# Patient Record
Sex: Female | Born: 1950 | Race: Black or African American | Hispanic: No | Marital: Single | Smoking: Current every day smoker
Health system: Southern US, Community
[De-identification: ages and names within clinical notes are randomized; demographics above are authoritative.]

## PROBLEM LIST (undated history)

## (undated) DIAGNOSIS — C7A8 Other malignant neuroendocrine tumors: Secondary | ICD-10-CM

## (undated) DIAGNOSIS — R42 Dizziness and giddiness: Secondary | ICD-10-CM

## (undated) DIAGNOSIS — Z87891 Personal history of nicotine dependence: Secondary | ICD-10-CM

## (undated) HISTORY — DX: Other malignant neuroendocrine tumors: C7A.8

## (undated) HISTORY — DX: Personal history of nicotine dependence: Z87.891

---

## 2004-12-22 ENCOUNTER — Ambulatory Visit: Payer: Self-pay | Admitting: Obstetrics & Gynecology

## 2004-12-26 ENCOUNTER — Ambulatory Visit: Payer: Self-pay | Admitting: Family Medicine

## 2005-03-07 ENCOUNTER — Ambulatory Visit: Payer: Self-pay | Admitting: Family Medicine

## 2005-04-10 ENCOUNTER — Ambulatory Visit: Payer: Self-pay | Admitting: Family Medicine

## 2008-05-11 ENCOUNTER — Ambulatory Visit: Payer: Self-pay

## 2008-11-10 ENCOUNTER — Ambulatory Visit: Payer: Self-pay

## 2009-07-18 ENCOUNTER — Emergency Department: Payer: Self-pay | Admitting: Emergency Medicine

## 2009-08-04 ENCOUNTER — Ambulatory Visit: Payer: Self-pay | Admitting: Sports Medicine

## 2012-05-16 ENCOUNTER — Ambulatory Visit: Payer: Self-pay | Admitting: Emergency Medicine

## 2012-05-16 LAB — RAPID INFLUENZA A&B ANTIGENS

## 2016-10-12 ENCOUNTER — Encounter: Payer: Self-pay | Admitting: Obstetrics & Gynecology

## 2016-10-15 ENCOUNTER — Ambulatory Visit (INDEPENDENT_AMBULATORY_CARE_PROVIDER_SITE_OTHER): Payer: PPO | Admitting: Obstetrics & Gynecology

## 2016-10-15 ENCOUNTER — Encounter: Payer: Self-pay | Admitting: Obstetrics & Gynecology

## 2016-10-15 VITALS — BP 120/70 | HR 74 | Ht 62.0 in | Wt 159.0 lb

## 2016-10-15 DIAGNOSIS — Z131 Encounter for screening for diabetes mellitus: Secondary | ICD-10-CM | POA: Diagnosis not present

## 2016-10-15 DIAGNOSIS — Z1321 Encounter for screening for nutritional disorder: Secondary | ICD-10-CM

## 2016-10-15 DIAGNOSIS — Z1231 Encounter for screening mammogram for malignant neoplasm of breast: Secondary | ICD-10-CM | POA: Diagnosis not present

## 2016-10-15 DIAGNOSIS — Z1211 Encounter for screening for malignant neoplasm of colon: Secondary | ICD-10-CM

## 2016-10-15 DIAGNOSIS — Z0000 Encounter for general adult medical examination without abnormal findings: Secondary | ICD-10-CM

## 2016-10-15 DIAGNOSIS — Z13220 Encounter for screening for lipoid disorders: Secondary | ICD-10-CM

## 2016-10-15 DIAGNOSIS — Z1329 Encounter for screening for other suspected endocrine disorder: Secondary | ICD-10-CM | POA: Diagnosis not present

## 2016-10-15 DIAGNOSIS — Z124 Encounter for screening for malignant neoplasm of cervix: Secondary | ICD-10-CM

## 2016-10-15 DIAGNOSIS — Z1239 Encounter for other screening for malignant neoplasm of breast: Secondary | ICD-10-CM

## 2016-10-15 DIAGNOSIS — Z01419 Encounter for gynecological examination (general) (routine) without abnormal findings: Secondary | ICD-10-CM

## 2016-10-15 NOTE — Patient Instructions (Signed)
PAP every three years Mammogram every year Colonoscopy every 10 years recommended Labs yearly

## 2016-10-15 NOTE — Progress Notes (Signed)
  HPI:      [name] is a 66 y.o. G1P1001 who LMP was in the past, she presents today for her annual examination.  The patient has no complaints today. The patient is sexually active. Herlast pap: approximate date 2016 and was abnormal: +HPV and last mammogram: was normal.  The patient does perform self breast exams.  There is no notable family history of breast or ovarian cancer in her family. The patient is not taking hormone replacement therapy. Patient denies post-menopausal vaginal bleeding.   The patient has regular exercise: yes. The patient denies current symptoms of depression.    GYN Hx: Last Colonoscopy:years ago. Normal.  Last DEXA: several years ago.    PMHx: History reviewed. No pertinent past medical history. History reviewed. No pertinent surgical history. Family History  Problem Relation Age of Onset  . Stroke Mother   . Colon cancer Father   . Prostate cancer Father   . Breast cancer Maternal Aunt 70  . Liver cancer Maternal Aunt   . Stomach cancer Maternal Aunt    Social History  Substance Use Topics  . Smoking status: Light Tobacco Smoker  . Smokeless tobacco: Never Used  . Alcohol use Yes   No current outpatient prescriptions on file. Allergies: Patient has no known allergies.  ROS  Objective: BP 120/70   Pulse 74   Ht 5\' 2"  (1.575 m)   Wt 159 lb (72.1 kg)   BMI 29.08 kg/m   Filed Weights   10/15/16 1346  Weight: 159 lb (72.1 kg)   Body mass index is 29.08 kg/m. OBGyn Exam  Assessment: Annual Exam 1. Annual physical exam   2. Screening for breast cancer   3. Screen for colon cancer   4. Screening for cervical cancer   5. Screening for cholesterol level   6. Encounter for vitamin deficiency screening   7. Screening for thyroid disorder   8. Screening for diabetes mellitus     Plan:            1.  Cervical Screening-  Pap smear done today, yeearly due to recent h/o HPV  2. Breast screening- Exam annually and mammogram  scheduled  3. Colonoscopy every 10 years, she refuses; Hemoccult testing after age 65  4. Labs Ordered today  5. Counseling for hormonal therapy: none     F/U  Return in about 1 year (around 10/15/2017) for Annual.  Barnett Applebaum, MD, [facility], [facility] 10/15/2016  2:10 PM

## 2016-10-16 ENCOUNTER — Encounter: Payer: Self-pay | Admitting: Obstetrics & Gynecology

## 2016-10-16 LAB — LIPID PANEL
Chol/HDL Ratio: 5.5 ratio — ABNORMAL HIGH (ref 0.0–4.4)
Cholesterol, Total: 227 mg/dL — ABNORMAL HIGH (ref 100–199)
HDL: 41 mg/dL (ref >39)
LDL Calculated: 150 mg/dL — ABNORMAL HIGH (ref 0–99)
Triglycerides: 182 mg/dL — ABNORMAL HIGH (ref 0–149)
VLDL Cholesterol Cal: 36 mg/dL (ref 5–40)

## 2016-10-16 LAB — VITAMIN D 25 HYDROXY (VIT D DEFICIENCY, FRACTURES): Vit D, 25-Hydroxy: 15.3 ng/mL — ABNORMAL LOW (ref 30.0–100.0)

## 2016-10-16 LAB — HEMOGLOBIN A1C
Est. average glucose Bld gHb Est-mCnc: 123 mg/dL
Hgb A1c MFr Bld: 5.9 % — ABNORMAL HIGH (ref 4.8–5.6)

## 2016-10-16 LAB — TSH: TSH: 0.454 u[IU]/mL (ref 0.450–4.500)

## 2016-10-17 LAB — IGP, APTIMA HPV
HPV Aptima: POSITIVE — AB
PAP Smear Comment: 0

## 2016-10-30 ENCOUNTER — Telehealth: Payer: Self-pay

## 2016-10-30 NOTE — Telephone Encounter (Signed)
Pt calling about results she received from her pap and physical.  [phone]

## 2016-10-30 NOTE — Telephone Encounter (Signed)
Looks like pap positive for HPV please advise

## 2017-01-29 NOTE — Telephone Encounter (Signed)
Letter was sent

## 2017-04-08 DIAGNOSIS — G4762 Sleep related leg cramps: Secondary | ICD-10-CM | POA: Diagnosis not present

## 2017-04-08 DIAGNOSIS — Z1389 Encounter for screening for other disorder: Secondary | ICD-10-CM | POA: Diagnosis not present

## 2017-04-08 DIAGNOSIS — Z23 Encounter for immunization: Secondary | ICD-10-CM | POA: Diagnosis not present

## 2017-04-08 DIAGNOSIS — R87810 Cervical high risk human papillomavirus (HPV) DNA test positive: Secondary | ICD-10-CM | POA: Diagnosis not present

## 2017-04-08 DIAGNOSIS — Z0000 Encounter for general adult medical examination without abnormal findings: Secondary | ICD-10-CM | POA: Diagnosis not present

## 2017-06-14 ENCOUNTER — Ambulatory Visit (INDEPENDENT_AMBULATORY_CARE_PROVIDER_SITE_OTHER): Payer: PPO | Admitting: Family Medicine

## 2017-06-14 ENCOUNTER — Encounter: Payer: Self-pay | Admitting: Family Medicine

## 2017-06-14 VITALS — BP 114/76 | HR 78 | Resp 16 | Ht 62.0 in | Wt 164.0 lb

## 2017-06-14 DIAGNOSIS — Z23 Encounter for immunization: Secondary | ICD-10-CM

## 2017-06-14 DIAGNOSIS — F329 Major depressive disorder, single episode, unspecified: Secondary | ICD-10-CM

## 2017-06-14 DIAGNOSIS — R2681 Unsteadiness on feet: Secondary | ICD-10-CM | POA: Insufficient documentation

## 2017-06-14 DIAGNOSIS — F32A Depression, unspecified: Secondary | ICD-10-CM

## 2017-06-14 DIAGNOSIS — E669 Obesity, unspecified: Secondary | ICD-10-CM | POA: Diagnosis not present

## 2017-06-14 DIAGNOSIS — E559 Vitamin D deficiency, unspecified: Secondary | ICD-10-CM | POA: Diagnosis not present

## 2017-06-14 DIAGNOSIS — E785 Hyperlipidemia, unspecified: Secondary | ICD-10-CM | POA: Diagnosis not present

## 2017-06-14 DIAGNOSIS — G4762 Sleep related leg cramps: Secondary | ICD-10-CM | POA: Diagnosis not present

## 2017-06-14 DIAGNOSIS — Z1239 Encounter for other screening for malignant neoplasm of breast: Secondary | ICD-10-CM

## 2017-06-14 DIAGNOSIS — G4700 Insomnia, unspecified: Secondary | ICD-10-CM | POA: Diagnosis not present

## 2017-06-14 DIAGNOSIS — R7303 Prediabetes: Secondary | ICD-10-CM | POA: Diagnosis not present

## 2017-06-14 DIAGNOSIS — Z1231 Encounter for screening mammogram for malignant neoplasm of breast: Secondary | ICD-10-CM | POA: Diagnosis not present

## 2017-06-14 DIAGNOSIS — B977 Papillomavirus as the cause of diseases classified elsewhere: Secondary | ICD-10-CM | POA: Insufficient documentation

## 2017-06-14 DIAGNOSIS — E66811 Obesity, class 1: Secondary | ICD-10-CM

## 2017-06-14 DIAGNOSIS — Z1211 Encounter for screening for malignant neoplasm of colon: Secondary | ICD-10-CM

## 2017-06-14 MED ORDER — MIRTAZAPINE 15 MG PO TABS: 15.0000 mg | ORAL_TABLET | Freq: Every day | ORAL | 2 refills | Status: DC

## 2017-06-14 MED ORDER — ZOSTER VAC RECOMB ADJUVANTED 50 MCG/0.5ML IM SUSR: 0.5000 mL | Freq: Once | INTRAMUSCULAR | 1 refills | Status: AC

## 2017-06-14 NOTE — Progress Notes (Signed)
Date:  06/14/2017   Name:  Kristin Escobar   DOB:  July 20, 1950   MRN:  [id number]  PCP:  Adline Potter, MD    Chief Complaint: Establish Care (Needs referral to eye doctor. )   History of Present Illness:  This is a 67 y.o. female seen for initial visit. Blood work done in May by GYN showed prediabetes and HLD, also vit D def and HPV positive. On no rx or OTC meds. Admits some depression and insomnia. Also c/o more gassy lately, Backus, halitosis, dentist no help. 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 Low BP in past. Father died prostate/colon ca 85, mother alive 48 with HTN, some DM in family. Tet imm 4 yrs ago, flu imm in Nov, no pneumo or zoster imms, last mammo 5 yrs ago, colonoscopy 10 yrs ago showed polyps  Review of Systems:  Review of Systems  Constitutional: Negative for chills and fever.  HENT: Negative for ear pain, sinus pain, sore throat and trouble swallowing.   Eyes: Negative for pain.  Respiratory: Negative for cough and shortness of breath.   Cardiovascular: Negative for chest pain and leg swelling.  Gastrointestinal: Negative for abdominal pain.  Endocrine: Negative for polydipsia and polyuria.  Genitourinary: Negative for difficulty urinating.  Musculoskeletal: Negative for joint swelling.  Neurological: Negative for syncope and light-headedness.  Hematological: Negative for adenopathy.    Patient Active Problem List   Diagnosis Date Noted  . Vitamin D deficiency 06/14/2017  . Gait instability 06/14/2017  . Hyperlipidemia 06/14/2017  . Depression 06/14/2017  . Insomnia 06/14/2017  . Obesity (BMI 30.0-34.9) 06/14/2017  . Prediabetes 06/14/2017  . 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 Nocturnal leg cramps 06/14/2017    Prior to Admission medications   Medication Sig Start Date End Date Taking? Authorizing Provider  Cholecalciferol (VITAMIN D3) 2000 units capsule Take 2,000 Units by mouth daily.   Yes [provider]  mirtazapine (REMERON) 15 MG  tablet Take 1 tablet (15 mg total) by mouth at bedtime. 06/14/17   Jozie Wulf, Gwyndolyn Saxon, MD  Zoster Vaccine Adjuvanted [facility]) injection Inject 0.5 mLs into the muscle once for 1 dose. 06/14/17 06/14/17  Adline Potter, MD    No Known Allergies  History reviewed. No pertinent surgical history.  Social History   Tobacco Use  . Smoking status: Light Tobacco Smoker    Packs/day: 0.25    Types: Cigarettes  . Smokeless tobacco: Never Used  Substance Use Topics  . Alcohol use: Yes  . Drug use: No    Family History  Problem Relation Age of Onset  . Stroke Mother   . Colon cancer Father   . Prostate cancer Father   . Breast cancer Maternal Aunt [id number]  . Liver cancer Maternal Aunt   . Stomach cancer Maternal Aunt     Medication list has been reviewed and updated.  Physical Examination: BP 114/76   Pulse 78   Resp 16   Ht 5\' 2"  (1.575 m)   Wt 164 lb (74.4 kg)   SpO2 98%   BMI 30.00 kg/m   Physical Exam  Constitutional: She appears well-developed and well-nourished.  HENT:  Head: Normocephalic and atraumatic.  Right Ear: External ear normal.  Left Ear: External ear normal.  Nose: Nose normal.  Mouth/Throat: Oropharynx is clear and moist.  TMs clear  Eyes: Conjunctivae and EOM are normal. Pupils are equal, round, and reactive to light.  Neck: Normal range of motion. Neck supple. No thyromegaly present.  Cardiovascular: Normal rate, regular rhythm and normal heart sounds.  Pulmonary/Chest: Effort normal and breath sounds normal.  Abdominal: Soft. She exhibits no distension and no mass. There is no tenderness.  Musculoskeletal: She exhibits no edema.  Lymphadenopathy:    She has no cervical adenopathy.  Neurological: She is alert. Coordination normal.  Romberg wobbly, gait sl unsteady  Skin: Skin is warm and dry.  Psychiatric: She has a normal mood and affect. Her behavior is normal.  Nursing note and vitals reviewed.   Assessment and Plan:  1. Prediabetes Dx/px  discussed - HgB A1c  2. HPV in female Recommend yearly pelvic exam per GYN  3. Depression, unspecified depression type Begin Remeron 15 mg qhs  4. Insomnia, unspecified type Remeron should help  5. Hyperlipidemia, unspecified hyperlipidemia type Recheck - TSH - Lipid Profile  6. Vitamin D deficiency Begin vit D 2000 IU daily, consider recheck level next visit  7. Gait instability - B12  8. Nocturnal leg cramps - Comprehensive Metabolic Panel (CMET) - CBC - Magnesium  9. Obesity (BMI 30.0-34.9) Exercise/weight loss discussed  10. Breast cancer screening - MM Digital Screening; Future  11. Colon cancer screening - Ambulatory referral to Gastroenterology  12. Need for pneumococcal vaccination - Pneumococcal conjugate vaccine 13-valent  13. Need for zoster vaccination - Zoster Vaccine Adjuvanted [facility]) injection; Inject 0.5 mLs into the muscle once for 1 dose.  Dispense: 0.5 mL; Refill: 1  Return in about 4 weeks (around 07/12/2017).   45 mins spent with pt over half in counseling  Satira Anis. [facility]  06/14/2017

## 2017-06-15 LAB — COMPREHENSIVE METABOLIC PANEL
ALT: 22 IU/L (ref 0–32)
AST: 23 IU/L (ref 0–40)
Albumin/Globulin Ratio: 1.5 (ref 1.2–2.2)
Albumin: 4.5 g/dL (ref 3.6–4.8)
Alkaline Phosphatase: 97 IU/L (ref 39–117)
BUN/Creatinine Ratio: 15 (ref 12–28)
BUN: 13 mg/dL (ref 8–27)
Bilirubin Total: 0.5 mg/dL (ref 0.0–1.2)
CO2: 21 mmol/L (ref 20–29)
Calcium: 9.9 mg/dL (ref 8.7–10.3)
Chloride: 106 mmol/L (ref 96–106)
Creatinine, Ser: 0.86 mg/dL (ref 0.57–1.00)
GFR calc Af Amer: 82 mL/min/{1.73_m2} (ref >59)
GFR calc non Af Amer: 71 mL/min/{1.73_m2} (ref >59)
Globulin, Total: 3.1 g/dL (ref 1.5–4.5)
Glucose: 91 mg/dL (ref 65–99)
Potassium: 4.7 mmol/L (ref 3.5–5.2)
Sodium: 145 mmol/L — ABNORMAL HIGH (ref 134–144)
Total Protein: 7.6 g/dL (ref 6.0–8.5)

## 2017-06-15 LAB — LIPID PANEL
Chol/HDL Ratio: 4.9 ratio — ABNORMAL HIGH (ref 0.0–4.4)
Cholesterol, Total: 242 mg/dL — ABNORMAL HIGH (ref 100–199)
HDL: 49 mg/dL (ref >39)
LDL Calculated: 151 mg/dL — ABNORMAL HIGH (ref 0–99)
Triglycerides: 209 mg/dL — ABNORMAL HIGH (ref 0–149)
VLDL Cholesterol Cal: 42 mg/dL — ABNORMAL HIGH (ref 5–40)

## 2017-06-15 LAB — VITAMIN B12: Vitamin B-12: 464 pg/mL (ref 232–1245)

## 2017-06-15 LAB — CBC
Hematocrit: 41.8 % (ref 34.0–46.6)
Hemoglobin: 14.3 g/dL (ref 11.1–15.9)
MCH: 32.4 pg (ref 26.6–33.0)
MCHC: 34.2 g/dL (ref 31.5–35.7)
MCV: 95 fL (ref 79–97)
Platelets: 237 10*3/uL (ref 150–379)
RBC: 4.41 x10E6/uL (ref 3.77–5.28)
RDW: 13.8 % (ref 12.3–15.4)
WBC: 5.6 10*3/uL (ref 3.4–10.8)

## 2017-06-15 LAB — MAGNESIUM: Magnesium: 2.1 mg/dL (ref 1.6–2.3)

## 2017-06-15 LAB — HEMOGLOBIN A1C
Est. average glucose Bld gHb Est-mCnc: 120 mg/dL
Hgb A1c MFr Bld: 5.8 % — ABNORMAL HIGH (ref 4.8–5.6)

## 2017-06-15 LAB — TSH: TSH: 0.592 u[IU]/mL (ref 0.450–4.500)

## 2017-06-25 ENCOUNTER — Other Ambulatory Visit: Payer: Self-pay

## 2017-06-25 DIAGNOSIS — Z1211 Encounter for screening for malignant neoplasm of colon: Secondary | ICD-10-CM

## 2017-06-25 DIAGNOSIS — Z860100 Personal history of colon polyps, unspecified: Secondary | ICD-10-CM

## 2017-06-25 NOTE — Progress Notes (Signed)
Gastroenterology Pre-Procedure Review  Request Date: 07/16/2017 Requesting Physician: Dr. Marius Ditch  PATIENT REVIEW QUESTIONS: The patient responded to the following health history questions as indicated:    1. Are you having any GI issues? no 2. Do you have a personal history of Polyps? yes (with last colonoscopy) 3. Do you have a family history of Colon Cancer or Polyps? yes (sister) 4. Diabetes Mellitus? no 5. Joint replacements in the past 12 months?no 6. Major health problems in the past 3 months?no 7. Any artificial heart valves, MVP, or defibrillator?no    MEDICATIONS & ALLERGIES:    Patient reports the following regarding taking any anticoagulation/antiplatelet therapy:   Plavix, Coumadin, Eliquis, Xarelto, Lovenox, Pradaxa, Brilinta, or Effient? no Aspirin? no  Patient confirms/reports the following medications:  Current Outpatient Medications  Medication Sig Dispense Refill  . Cholecalciferol (VITAMIN D3) 2000 units capsule Take 2,000 Units by mouth daily.    . mirtazapine (REMERON) 15 MG tablet Take 1 tablet (15 mg total) by mouth at bedtime. 30 tablet 2   No current facility-administered medications for this visit.     Patient confirms/reports the following allergies:  No Known Allergies  Orders Placed This Encounter  Procedures  . Procedural/ Surgical Case Request: COLONOSCOPY WITH PROPOFOL    Standing Status:   Standing    Number of Occurrences:   1    Order Specific Question:   Pre-op diagnosis    Answer:   z86.010 hx of colon polyps/z12.11 screening for colon cancer    Order Specific Question:   CPT Code    Answer:   93570    AUTHORIZATION INFORMATION Primary Insurance: 1D#: Group #:  Secondary Insurance: 1D#: Group #:  SCHEDULE INFORMATION: Date: 07/16/2017 Time: Location:[facility]

## 2017-06-26 ENCOUNTER — Ambulatory Visit
Admission: RE | Admit: 2017-06-26 | Discharge: 2017-06-26 | Disposition: A | Discharge status: Home / Self Care | Payer: PPO | Source: Ambulatory Visit | Attending: Family Medicine | Admitting: Family Medicine

## 2017-06-26 DIAGNOSIS — Z1231 Encounter for screening mammogram for malignant neoplasm of breast: Secondary | ICD-10-CM | POA: Insufficient documentation

## 2017-06-26 DIAGNOSIS — Z1239 Encounter for other screening for malignant neoplasm of breast: Secondary | ICD-10-CM

## 2017-07-02 ENCOUNTER — Inpatient Hospital Stay
Admission: RE | Admit: 2017-07-02 | Discharge: 2017-07-02 | Disposition: A | Discharge status: Home / Self Care | Payer: Self-pay | Source: Ambulatory Visit | Attending: *Deleted | Admitting: *Deleted

## 2017-07-02 ENCOUNTER — Other Ambulatory Visit: Payer: Self-pay | Admitting: *Deleted

## 2017-07-02 DIAGNOSIS — Z9289 Personal history of other medical treatment: Secondary | ICD-10-CM

## 2017-07-03 ENCOUNTER — Telehealth: Payer: Self-pay | Admitting: Gastroenterology

## 2017-07-03 NOTE — Telephone Encounter (Signed)
Patient has been informed her colonoscopy is scheduled for 07/17/17 at [facility].

## 2017-07-03 NOTE — Telephone Encounter (Signed)
Patient LVM and is confused on what day her procedure is on, please call her.

## 2017-07-11 ENCOUNTER — Encounter: Payer: Self-pay | Admitting: *Deleted

## 2017-07-11 ENCOUNTER — Other Ambulatory Visit: Payer: Self-pay

## 2017-07-12 ENCOUNTER — Encounter: Payer: Self-pay | Admitting: Family Medicine

## 2017-07-12 ENCOUNTER — Ambulatory Visit (INDEPENDENT_AMBULATORY_CARE_PROVIDER_SITE_OTHER): Payer: PPO | Admitting: Family Medicine

## 2017-07-12 VITALS — BP 118/80 | HR 76 | Resp 16 | Ht 62.0 in | Wt 164.0 lb

## 2017-07-12 DIAGNOSIS — F17201 Nicotine dependence, unspecified, in remission: Secondary | ICD-10-CM

## 2017-07-12 DIAGNOSIS — F17200 Nicotine dependence, unspecified, uncomplicated: Secondary | ICD-10-CM | POA: Diagnosis not present

## 2017-07-12 DIAGNOSIS — E785 Hyperlipidemia, unspecified: Secondary | ICD-10-CM | POA: Diagnosis not present

## 2017-07-12 DIAGNOSIS — F329 Major depressive disorder, single episode, unspecified: Secondary | ICD-10-CM | POA: Diagnosis not present

## 2017-07-12 DIAGNOSIS — E559 Vitamin D deficiency, unspecified: Secondary | ICD-10-CM

## 2017-07-12 DIAGNOSIS — G4700 Insomnia, unspecified: Secondary | ICD-10-CM | POA: Diagnosis not present

## 2017-07-12 DIAGNOSIS — R7303 Prediabetes: Secondary | ICD-10-CM

## 2017-07-12 DIAGNOSIS — F32A Depression, unspecified: Secondary | ICD-10-CM

## 2017-07-12 MED ORDER — BUPROPION HCL ER (XL) 150 MG PO TB24: 150.0000 mg | ORAL_TABLET | Freq: Every day | ORAL | 2 refills | Status: DC

## 2017-07-12 NOTE — Progress Notes (Signed)
Date:  07/12/2017   Name:  Kristin Escobar   DOB:  [birth year]   MRN:  [id number]  PCP:  Adline Potter, MD    Chief Complaint: Hyperlipidemia (getting colonscopy Wed on special prep diet )   History of Present Illness:  This is a 67 y.o. female seen for one month f/u from initial visit. Never started Remeron, concerned about addiction, mood still poor and having terminal insomnia. Wants to quit smoking, has quit for years in past but goes back, quit for three weeks in January, wants to try medication. Prediabetes, HLD stable last visit butvit D level low, on supplement. For colonoscopy next week.   Review of Systems:  Review of Systems  Constitutional: Negative for chills and fever.  Respiratory: Negative for cough and shortness of breath.   Cardiovascular: Negative for chest pain and leg swelling.  Genitourinary: Negative for difficulty urinating.  Neurological: Negative for syncope and light-headedness.    Patient Active Problem List   Diagnosis Date Noted  . Smoker 07/12/2017  . Vitamin D deficiency 06/14/2017  . Gait instability 06/14/2017  . Hyperlipidemia 06/14/2017  . Depression 06/14/2017  . Insomnia 06/14/2017  . Obesity (BMI 30.0-34.9) 06/14/2017  . Prediabetes 06/14/2017  . 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 Nocturnal leg cramps 06/14/2017    Prior to Admission medications   Medication Sig Start Date End Date Taking? Authorizing Provider  Cholecalciferol (VITAMIN D3) 2000 units capsule Take 2,000 Units by mouth daily.   Yes [provider]  buPROPion (WELLBUTRIN XL) 150 MG 24 hr tablet Take 1 tablet (150 mg total) by mouth daily. 07/12/17   Adline Potter, MD    No Known Allergies  History reviewed. No pertinent surgical history.  Social History   Tobacco Use  . Smoking status: Light Tobacco Smoker    Packs/day: 0.25    Types: Cigarettes  . Smokeless tobacco: Never Used  . Tobacco comment: 1 pack every 1-2 weeks  Substance Use Topics  . Alcohol use:  Yes    Comment: 1-2 drinks/month  . Drug use: No    Family History  Problem Relation Age of Onset  . Stroke Mother   . Colon cancer Father   . Prostate cancer Father   . Breast cancer Maternal Aunt 70  . Liver cancer Maternal Aunt   . Stomach cancer Maternal Aunt     Medication list has been reviewed and updated.  Physical Examination: BP 118/80   Pulse 76   Resp 16   Ht 5\' 2"  (1.575 m)   Wt 164 lb (74.4 kg)   BMI 30.00 kg/m   Physical Exam  Constitutional: She appears well-developed and well-nourished.  Cardiovascular: Normal rate, regular rhythm and normal heart sounds.  Pulmonary/Chest: Effort normal and breath sounds normal.  Neurological: She is alert.  Skin: Skin is warm and dry.  Psychiatric: She has a normal mood and affect. Her behavior is normal.  Nursing note and vitals reviewed.   Assessment and Plan:  1. Depression, unspecified depression type Wellbutrin XR 150 mg daily, may also help stop smoking  2. Insomnia, unspecified type May improve on Wellbutrin XR  3. Hyperlipidemia, unspecified hyperlipidemia type Stable, CVR 7.4%, monitor  4. Prediabetes Stable, a1c 5.8%  5. Smoker Discussed physical vs. psychological issues, Wellbutrin XR may help  6. Vitamin D deficiency On supplement - Vitamin D (25 hydroxy)  Return in about 3 months (around 10/09/2017).  Satira Anis. [facility]  07/12/2017

## 2017-07-13 LAB — VITAMIN D 25 HYDROXY (VIT D DEFICIENCY, FRACTURES): Vit D, 25-Hydroxy: 16.7 ng/mL — ABNORMAL LOW (ref 30.0–100.0)

## 2017-07-15 ENCOUNTER — Other Ambulatory Visit: Payer: Self-pay | Admitting: Family Medicine

## 2017-07-15 MED ORDER — VITAMIN D3 125 MCG (5000 UT) PO CAPS: 1.0000 | ORAL_CAPSULE | Freq: Every day | ORAL | Status: DC

## 2017-07-15 NOTE — Discharge Instructions (Signed)
General Anesthesia, Adult, Care After °These instructions provide you with information about caring for yourself after your procedure. Your health care provider may also give you more specific instructions. Your treatment has been planned according to current medical practices, but problems sometimes occur. Call your health care provider if you have any problems or questions after your procedure. °What can I expect after the procedure? °After the procedure, it is common to have: °· Vomiting. °· A sore throat. °· Mental slowness. ° °It is common to feel: °· Nauseous. °· Cold or shivery. °· Sleepy. °· Tired. °· Sore or achy, even in parts of your body where you did not have surgery. ° °Follow these instructions at home: °For at least 24 hours after the procedure: °· Do not: °? Participate in activities where you could fall or become injured. °? Drive. °? Use heavy machinery. °? Drink alcohol. °? Take sleeping pills or medicines that cause drowsiness. °? Make important decisions or sign legal documents. °? Take care of children on your own. °· Rest. °Eating and drinking °· If you vomit, drink water, juice, or soup when you can drink without vomiting. °· Drink enough fluid to keep your urine clear or pale yellow. °· Make sure you have little or no nausea before eating solid foods. °· Follow the diet recommended by your health care provider. °General instructions °· Have a responsible adult stay with you until you are awake and alert. °· Return to your normal activities as told by your health care provider. Ask your health care provider what activities are safe for you. °· Take over-the-counter and prescription medicines only as told by your health care provider. °· If you smoke, do not smoke without supervision. °· Keep all follow-up visits as told by your health care provider. This is important. °Contact a health care provider if: °· You continue to have nausea or vomiting at home, and medicines are not helpful. °· You  cannot drink fluids or start eating again. °· You cannot urinate after 8-12 hours. °· You develop a skin rash. °· You have fever. °· You have increasing redness at the site of your procedure. °Get help right away if: °· You have difficulty breathing. °· You have chest pain. °· You have unexpected bleeding. °· You feel that you are having a life-threatening or urgent problem. °This information is not intended to replace advice given to you by your health care provider. Make sure you discuss any questions you have with your health care provider. °Document Released: 08/20/2000 Document Revised: 10/17/2015 Document Reviewed: 04/28/2015 °Elsevier Interactive Patient Education © 2018 [employer] ° °

## 2017-07-17 ENCOUNTER — Ambulatory Visit: Payer: PPO | Admitting: Anesthesiology

## 2017-07-17 ENCOUNTER — Encounter
Admission: RE | Disposition: A | Discharge status: Home / Self Care | Payer: Self-pay | Source: Ambulatory Visit | Attending: Gastroenterology

## 2017-07-17 ENCOUNTER — Ambulatory Visit
Admission: RE | Admit: 2017-07-17 | Discharge: 2017-07-17 | Disposition: A | Discharge status: Home / Self Care | Payer: PPO | Source: Ambulatory Visit | Attending: Gastroenterology | Admitting: Gastroenterology

## 2017-07-17 DIAGNOSIS — D1339 Benign neoplasm of other parts of small intestine: Secondary | ICD-10-CM | POA: Diagnosis not present

## 2017-07-17 DIAGNOSIS — Z1211 Encounter for screening for malignant neoplasm of colon: Secondary | ICD-10-CM | POA: Insufficient documentation

## 2017-07-17 DIAGNOSIS — D3A8 Other benign neuroendocrine tumors: Secondary | ICD-10-CM | POA: Insufficient documentation

## 2017-07-17 DIAGNOSIS — D124 Benign neoplasm of descending colon: Secondary | ICD-10-CM | POA: Diagnosis not present

## 2017-07-17 DIAGNOSIS — D120 Benign neoplasm of cecum: Secondary | ICD-10-CM | POA: Diagnosis not present

## 2017-07-17 DIAGNOSIS — F17210 Nicotine dependence, cigarettes, uncomplicated: Secondary | ICD-10-CM | POA: Insufficient documentation

## 2017-07-17 DIAGNOSIS — K5730 Diverticulosis of large intestine without perforation or abscess without bleeding: Secondary | ICD-10-CM | POA: Diagnosis not present

## 2017-07-17 DIAGNOSIS — F329 Major depressive disorder, single episode, unspecified: Secondary | ICD-10-CM | POA: Insufficient documentation

## 2017-07-17 DIAGNOSIS — D3A00 Benign carcinoid tumor of unspecified site: Secondary | ICD-10-CM | POA: Diagnosis not present

## 2017-07-17 HISTORY — PX: POLYPECTOMY: SHX5525

## 2017-07-17 HISTORY — PX: COLONOSCOPY WITH PROPOFOL: SHX5780

## 2017-07-17 HISTORY — DX: Dizziness and giddiness: R42

## 2017-07-17 SURGERY — COLONOSCOPY WITH PROPOFOL
Anesthesia: General | Site: Rectum | Wound class: Contaminated

## 2017-07-17 MED ORDER — STERILE WATER FOR IRRIGATION IR SOLN
Status: DC | PRN
  Administered 2017-07-17: .5 mL

## 2017-07-17 MED ORDER — ACETAMINOPHEN 160 MG/5ML PO SOLN: 325.0000 mg | ORAL | Status: DC | PRN

## 2017-07-17 MED ORDER — LIDOCAINE HCL (CARDIAC) 20 MG/ML IV SOLN
INTRAVENOUS | Status: DC | PRN
  Administered 2017-07-17: 40 mg via INTRAVENOUS

## 2017-07-17 MED ORDER — ACETAMINOPHEN 325 MG PO TABS: 650.0000 mg | ORAL_TABLET | Freq: Once | ORAL | Status: DC | PRN

## 2017-07-17 MED ORDER — LACTATED RINGERS IV SOLN
INTRAVENOUS | Status: DC
  Administered 2017-07-17: 08:00:00 via INTRAVENOUS

## 2017-07-17 MED ORDER — ONDANSETRON HCL 4 MG/2ML IJ SOLN: 4.0000 mg | Freq: Once | INTRAMUSCULAR | Status: DC | PRN

## 2017-07-17 MED ORDER — PROPOFOL 10 MG/ML IV BOLUS
INTRAVENOUS | Status: DC | PRN
  Administered 2017-07-17: 20 mg via INTRAVENOUS
  Administered 2017-07-17: 30 mg via INTRAVENOUS
  Administered 2017-07-17 (×3): 20 mg via INTRAVENOUS
  Administered 2017-07-17: 30 mg via INTRAVENOUS
  Administered 2017-07-17: 20 mg via INTRAVENOUS
  Administered 2017-07-17: 30 mg via INTRAVENOUS
  Administered 2017-07-17: 20 mg via INTRAVENOUS
  Administered 2017-07-17: 10 mg via INTRAVENOUS
  Administered 2017-07-17 (×3): 20 mg via INTRAVENOUS
  Administered 2017-07-17: 30 mg via INTRAVENOUS
  Administered 2017-07-17: 20 mg via INTRAVENOUS
  Administered 2017-07-17: 100 mg via INTRAVENOUS
  Administered 2017-07-17: 40 mg via INTRAVENOUS
  Administered 2017-07-17 (×2): 20 mg via INTRAVENOUS
  Administered 2017-07-17: 30 mg via INTRAVENOUS

## 2017-07-17 SURGICAL SUPPLY — 14 items
CANISTER SUCT 1200ML W/VALVE (MISCELLANEOUS) ×4 IMPLANT
CLIP HMST 235XBRD CATH ROT (MISCELLANEOUS) ×2 IMPLANT
CLIP RESOLUTION 360 11X235 (MISCELLANEOUS) ×2
ELECT REM PT RETURN 9FT ADLT (ELECTROSURGICAL) ×4
ELECTRODE REM PT RTRN 9FT ADLT (ELECTROSURGICAL) ×2 IMPLANT
ELEVIEW SUBMUCOSAL INJECTABLE COMPOSITION ×4 IMPLANT
FORCEPS BIOP RAD 4 LRG CAP 4 (CUTTING FORCEPS) ×4 IMPLANT
GOWN CVR UNV OPN BCK APRN NK (MISCELLANEOUS) ×4 IMPLANT
GOWN ISOL THUMB LOOP REG UNIV (MISCELLANEOUS) ×4
KIT ENDO PROCEDURE OLY (KITS) ×4 IMPLANT
SNARE SHORT THROW 13M SML OVAL (MISCELLANEOUS) ×4 IMPLANT
SNARE SPIRAL (MISCELLANEOUS) ×4 IMPLANT
TRAP ETRAP POLY (MISCELLANEOUS) ×4 IMPLANT
WATER STERILE IRR 250ML POUR (IV SOLUTION) ×4 IMPLANT

## 2017-07-17 NOTE — Transfer of Care (Signed)
Immediate Anesthesia Transfer of Care Note  Patient: Kristin Escobar  Procedure(s) Performed: COLONOSCOPY WITH PROPOFOL (N/A Rectum) POLYPECTOMY (Rectum)  Patient Location: PACU  Anesthesia Type: General  Level of Consciousness: awake, alert  and patient cooperative  Airway and Oxygen Therapy: Patient Spontanous Breathing and Patient connected to supplemental oxygen  Post-op Assessment: Post-op Vital signs reviewed, Patient's Cardiovascular Status Stable, Respiratory Function Stable, Patent Airway and No signs of Nausea or vomiting  Post-op Vital Signs: Reviewed and stable  Complications: No apparent anesthesia complications

## 2017-07-17 NOTE — Op Note (Signed)
[facility] Gastroenterology Patient Name: Kristin Escobar Procedure Date: 07/17/2017 7:44 AM MRN: [id number] Account #: [id number] Date of Birth: 06/24/1950 Admit Type: Outpatient Age: 67 Room: [facility] OR ROOM 01 Gender: Female Note Status: Finalized Procedure:            Colonoscopy Indications:          Screening for colorectal malignant neoplasm (last                        colonoscopy was more than 10 years ago) Providers:            Lin Landsman MD, MD Referring MD:         Satira Anis. Plonk, MD (Referring MD) Medicines:            Monitored Anesthesia Care Complications:        No immediate complications. Estimated blood loss:                        Minimal. Procedure:            Pre-Anesthesia Assessment:                       - Prior to the procedure, a History and Physical was                        performed, and patient medications and allergies were                        reviewed. The patient is competent. The risks and                        benefits of the procedure and the sedation options and                        risks were discussed with the patient. All questions                        were answered and informed consent was obtained.                        Patient identification and proposed procedure were                        verified by the physician, the nurse, the                        anesthesiologist, the anesthetist and the technician in                        the pre-procedure area in the procedure room in the                        endoscopy suite. Mental Status Examination: alert and                        oriented. Airway Examination: normal oropharyngeal                        airway and neck mobility. Respiratory Examination:  clear to auscultation. CV Examination: normal.                        Prophylactic Antibiotics: The patient does not require                        prophylactic antibiotics. Prior  Anticoagulants: The                        patient has taken no previous anticoagulant or                        antiplatelet agents. ASA Grade Assessment: II - A                        patient with mild systemic disease. After reviewing the                        risks and benefits, the patient was deemed in                        satisfactory condition to undergo the procedure. The                        anesthesia plan was to use monitored anesthesia care                        (MAC). Immediately prior to administration of                        medications, the patient was re-assessed for adequacy                        to receive sedatives. The heart rate, respiratory rate,                        oxygen saturations, blood pressure, adequacy of                        pulmonary ventilation, and response to care were                        monitored throughout the procedure. The physical status                        of the patient was re-assessed after the procedure.                       After obtaining informed consent, the colonoscope was                        passed under direct vision. Throughout the procedure,                        the patient's blood pressure, pulse, and oxygen                        saturations were monitored continuously. The Olympus  Colonoscope [id number]) was introduced through the                        anus and advanced to the the terminal ileum. The                        colonoscopy was performed without difficulty. The                        patient tolerated the procedure well. The quality of                        the bowel preparation was evaluated using the BBPS                        [facility] Bowel Preparation Scale) with scores of: Right                        Colon = 3, Transverse Colon = 3 and Left Colon = 3                        (entire mucosa seen well with no residual staining,                        small fragments  of stool or opaque liquid). The total                        BBPS score equals 9. Findings:      The perianal and digital rectal examinations were normal. Pertinent       negatives include normal sphincter tone and no palpable rectal lesions.      The terminal ileum contained one sessile, non-bleeding polyp. The polyp       was 12 mm in diameter. Biopsies were taken with a cold forceps for       histology.      A 9 mm polyp was found in the cecum. The polyp was flat. Preparations       were made for mucosal resection. Methylene blue was injected to raise       the lesion. Snare mucosal resection was performed. Resection and       retrieval were complete. To prevent bleeding after mucosal resection,       one hemostatic clip was successfully placed. There was no bleeding at       the end of the procedure.      A 5 mm polyp was found in the cecum. The polyp was sessile. The polyp       was removed with a cold snare. Resection and retrieval were complete.      A 7 mm polyp was found in the cecum. The polyp was sessile. The polyp       was removed with a hot snare. Resection and retrieval were complete. To       prevent bleeding after the polypectomy, one hemostatic clip was       successfully placed. There was no bleeding at the end of the procedure.      A 7 mm polyp was found in the descending colon. The polyp was sessile.       The polyp was removed with a hot snare. Resection and retrieval were  complete.      Many small and large-mouthed diverticula were found in the sigmoid colon       and descending colon.      The retroflexed view of the distal rectum and anal verge was normal and       showed no anal or rectal abnormalities. Impression:           - One ileal polypoid lesion in the terminal ileum.                        Resection not attempted. Biopsied.                       - One 9 mm polyp in the cecum, removed with mucosal                        resection. Resected and  retrieved. Clip was placed.                       - One 5 mm polyp in the cecum, removed with a cold                        snare. Resected and retrieved.                       - One 7 mm polyp in the cecum, removed with a hot                        snare. Resected and retrieved. Clip was placed.                       - One 7 mm polyp in the descending colon, removed with                        a hot snare. Resected and retrieved.                       - Diverticulosis in the sigmoid colon and in the                        descending colon.                       - The distal rectum and anal verge are normal on                        retroflexion view.                       - Mucosal resection was performed. Resection and                        retrieval were complete. Recommendation:       - Await pathology results.                       - Will perform CTE based on histoogy of the TI polypoid                        lesion                       -  Repeat colonoscopy in 3 years or soner based on                        pathlogy for surveillance.                       - Discharge patient to home.                       - Resume previous diet today.                       - Continue present medications. Procedure Code(s):    --- Professional ---                       [phone], 59, Colonoscopy, flexible; with endoscopic                        mucosal resection                       [id number], Colonoscopy, flexible; with removal of tumor(s),                        polyp(s), or other lesion(s) by snare technique                       45380, 40, Colonoscopy, flexible; with biopsy, single                        or multiple Diagnosis Code(s):    --- Professional ---                       Z12.11, Encounter for screening for malignant neoplasm                        of colon                       D13.39, Benign neoplasm of other parts of small                        intestine                       D12.0,  Benign neoplasm of cecum                       D12.4, Benign neoplasm of descending colon                       K57.30, Diverticulosis of large intestine without                        perforation or abscess without bleeding CPT copyright 2016 American Medical Association. All rights reserved. The codes documented in this report are preliminary and upon coder review may  be revised to meet current compliance requirements. Dr. Ulyess Mort Lin Landsman MD, MD 07/17/2017 9:06:19 AM This report has been signed electronically. Number of Addenda: 0 Note Initiated On: 07/17/2017 7:44 AM Scope Withdrawal Time: 0 hours 33 minutes 34 seconds  Total Procedure Duration: 0 hours 36 minutes 8 seconds       [facility]  [facility]

## 2017-07-17 NOTE — Anesthesia Procedure Notes (Signed)
Procedure Name: MAC Date/Time: 07/17/2017 8:12 AM Performed by: Janna Arch, CRNA Pre-anesthesia Checklist: Patient identified, Emergency Drugs available, Suction available and Patient being monitored Patient Re-evaluated:Patient Re-evaluated prior to induction Oxygen Delivery Method: Nasal cannula

## 2017-07-17 NOTE — Anesthesia Preprocedure Evaluation (Signed)
Anesthesia Evaluation  Patient identified by MRN, date of birth, ID band Patient awake    Reviewed: Allergy & Precautions, NPO status , Patient's Chart, lab work & pertinent test results  History of Anesthesia Complications Negative for: history of anesthetic complications  Airway Mallampati: I  TM Distance: >3 FB Neck ROM: Full    Dental  (+)    Pulmonary Current Smoker (1 pack per week),    Pulmonary exam normal breath sounds clear to auscultation       Cardiovascular Exercise Tolerance: Good negative cardio ROS Normal cardiovascular exam Rhythm:Regular Rate:Normal     Neuro/Psych PSYCHIATRIC DISORDERS Depression Vertigo     GI/Hepatic negative GI ROS,   Endo/Other  Pre-diabetes  Renal/GU negative Renal ROS     Musculoskeletal   Abdominal   Peds  Hematology negative hematology ROS (+)   Anesthesia Other Findings   Reproductive/Obstetrics                             Anesthesia Physical Anesthesia Plan  ASA: II  Anesthesia Plan: General   Post-op Pain Management:    Induction: Intravenous  PONV Risk Score and Plan: 2 and Propofol infusion and TIVA  Airway Management Planned: Natural Airway  Additional Equipment:   Intra-op Plan:   Post-operative Plan:   Informed Consent: I have reviewed the patients History and Physical, chart, labs and discussed the procedure including the risks, benefits and alternatives for the proposed anesthesia with the patient or authorized representative who has indicated his/her understanding and acceptance.     Plan Discussed with: CRNA  Anesthesia Plan Comments:         Anesthesia Quick Evaluation

## 2017-07-17 NOTE — Anesthesia Postprocedure Evaluation (Signed)
Anesthesia Post Note  Patient: Kristin Escobar  Procedure(s) Performed: COLONOSCOPY WITH PROPOFOL (N/A Rectum) POLYPECTOMY (Rectum)  Patient location during evaluation: PACU Anesthesia Type: General Level of consciousness: awake and alert, oriented and patient cooperative Pain management: pain level controlled Vital Signs Assessment: post-procedure vital signs reviewed and stable Respiratory status: spontaneous breathing, nonlabored ventilation and respiratory function stable Cardiovascular status: blood pressure returned to baseline and stable Postop Assessment: adequate PO intake Anesthetic complications: no    Darrin Nipper

## 2017-07-17 NOTE — H&P (Signed)
  Cephas Darby, MD [address]  [place]  [place], [place] [address]  Main: [phone]  Fax: [phone] Pager: [phone]  Primary Care Physician:  Adline Potter, MD Primary Gastroenterologist:  Dr. Cephas Darby  Pre-Procedure History & Physical: HPI:  Kristin Escobar is a 67 y.o. female is here for an colonoscopy.   Past Medical History:  Diagnosis Date  . Former smoker   . Vertigo    no episodes in over 10 yrs    History reviewed. No pertinent surgical history.  Prior to Admission medications   Medication Sig Start Date End Date Taking? Authorizing Provider  Cholecalciferol (VITAMIN D3) 5000 units CAPS Take 1 capsule (5,000 Units total) by mouth daily. 07/15/17  Yes Plonk, Gwyndolyn Saxon, MD  buPROPion (WELLBUTRIN XL) 150 MG 24 hr tablet Take 1 tablet (150 mg total) by mouth daily. Patient not taking: Reported on 07/17/2017 07/12/17   Adline Potter, MD    Allergies as of 06/25/2017  . (No Known Allergies)    Family History  Problem Relation Age of Onset  . Stroke Mother   . Colon cancer Father   . Prostate cancer Father   . Breast cancer Maternal Aunt 70  . Liver cancer Maternal Aunt   . Stomach cancer Maternal Aunt     Social History   Socioeconomic History  . Marital status: Single    Spouse name: Not on file  . Number of children: Not on file  . Years of education: Not on file  . Highest education level: Not on file  Social Needs  . Financial resource strain: Somewhat hard  . Food insecurity - worry: Sometimes true  . Food insecurity - inability: Never true  . Transportation needs - medical: Patient refused  . Transportation needs - non-medical: Patient refused  Occupational History  . Occupation: Retired   Tobacco Use  . Smoking status: Light Tobacco Smoker    Packs/day: 0.25    Types: Cigarettes  . Smokeless tobacco: Never Used  . Tobacco comment: 1 pack every 1-2 weeks  Substance and Sexual Activity  . Alcohol use: Yes    Comment:  1-2 drinks/month  . Drug use: No  . Sexual activity: No  Other Topics Concern  . Not on file  Social History Narrative  . Not on file    Review of Systems: See HPI, otherwise negative ROS  Physical Exam: BP 104/64   Pulse 74   Temp 98.1 F (36.7 C)   Ht 5\' 2"  (1.575 m)   Wt 162 lb (73.5 kg)   SpO2 97%   BMI 29.63 kg/m  General:   Alert,  pleasant and cooperative in NAD Head:  Normocephalic and atraumatic. Neck:  Supple; no masses or thyromegaly. Lungs:  Clear throughout to auscultation.    Heart:  Regular rate and rhythm. Abdomen:  Soft, nontender and nondistended. Normal bowel sounds, without guarding, and without rebound.   Neurologic:  Alert and  oriented x4;  grossly normal neurologically.  Impression/Plan: Kristin Escobar is here for an colonoscopy to be performed for colon cancer screening  Risks, benefits, limitations, and alternatives regarding  colonoscopy have been reviewed with the patient.  Questions have been answered.  All parties agreeable.   Sherri Sear, MD  07/17/2017, 8:01 AM

## 2017-07-18 ENCOUNTER — Encounter: Payer: Self-pay | Admitting: Gastroenterology

## 2017-07-22 ENCOUNTER — Encounter: Payer: Self-pay | Admitting: Gastroenterology

## 2017-07-22 ENCOUNTER — Telehealth: Payer: Self-pay | Admitting: Gastroenterology

## 2017-07-22 DIAGNOSIS — C7A8 Other malignant neuroendocrine tumors: Secondary | ICD-10-CM

## 2017-07-22 NOTE — Telephone Encounter (Signed)
Called patient to discuss about results of recent colonoscopy. 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     She will be referred to oncology for further management Will order multiphasic CT scan first for TNM staging She will need colonoscopy for colon cancer surveillance in 3 years  Patient expressed understanding of the plan  Cephas Darby, MD [place]  [place],  [address]  Main: [phone]  Fax: [phone] Pager: [phone]

## 2017-07-23 ENCOUNTER — Telehealth: Payer: Self-pay

## 2017-07-23 ENCOUNTER — Other Ambulatory Visit: Payer: Self-pay

## 2017-07-23 DIAGNOSIS — D449 Neoplasm of uncertain behavior of unspecified endocrine gland: Secondary | ICD-10-CM

## 2017-07-23 NOTE — Telephone Encounter (Signed)
Patient has been informed her CT Scan has been scheduled for March 4th at [facility] arrival time @8 :30am.  Nothing to eat or drink 4 hours prior to CT.  Referral has been placed in Epic for Oncology.  She would like a phone call to further explain Neuro Endocrine Tumor.  Thanks [employer]

## 2017-07-24 ENCOUNTER — Other Ambulatory Visit: Payer: Self-pay

## 2017-07-24 ENCOUNTER — Encounter: Payer: Self-pay | Admitting: Internal Medicine

## 2017-07-24 ENCOUNTER — Encounter: Payer: Self-pay | Admitting: *Deleted

## 2017-07-24 ENCOUNTER — Inpatient Hospital Stay: Payer: PPO

## 2017-07-24 ENCOUNTER — Inpatient Hospital Stay: Payer: PPO | Attending: Internal Medicine | Admitting: Internal Medicine

## 2017-07-24 DIAGNOSIS — Z08 Encounter for follow-up examination after completed treatment for malignant neoplasm: Secondary | ICD-10-CM

## 2017-07-24 DIAGNOSIS — Z8042 Family history of malignant neoplasm of prostate: Secondary | ICD-10-CM | POA: Diagnosis not present

## 2017-07-24 DIAGNOSIS — F17210 Nicotine dependence, cigarettes, uncomplicated: Secondary | ICD-10-CM | POA: Diagnosis not present

## 2017-07-24 DIAGNOSIS — C7A8 Other malignant neuroendocrine tumors: Secondary | ICD-10-CM

## 2017-07-24 DIAGNOSIS — Z79899 Other long term (current) drug therapy: Secondary | ICD-10-CM | POA: Diagnosis not present

## 2017-07-24 DIAGNOSIS — Z800 Family history of malignant neoplasm of digestive organs: Secondary | ICD-10-CM | POA: Diagnosis not present

## 2017-07-24 DIAGNOSIS — Z803 Family history of malignant neoplasm of breast: Secondary | ICD-10-CM | POA: Insufficient documentation

## 2017-07-24 DIAGNOSIS — Z8589 Personal history of malignant neoplasm of other organs and systems: Secondary | ICD-10-CM | POA: Insufficient documentation

## 2017-07-24 DIAGNOSIS — Z808 Family history of malignant neoplasm of other organs or systems: Secondary | ICD-10-CM | POA: Diagnosis not present

## 2017-07-24 HISTORY — DX: Other malignant neuroendocrine tumors: C7A.8

## 2017-07-24 LAB — COMPREHENSIVE METABOLIC PANEL
ALT: 22 U/L (ref 14–54)
AST: 24 U/L (ref 15–41)
Albumin: 4.1 g/dL (ref 3.5–5.0)
Alkaline Phosphatase: 87 U/L (ref 38–126)
Anion gap: 10 (ref 5–15)
BUN: 16 mg/dL (ref 6–20)
CO2: 24 mmol/L (ref 22–32)
Calcium: 9.1 mg/dL (ref 8.9–10.3)
Chloride: 107 mmol/L (ref 101–111)
Creatinine, Ser: 0.98 mg/dL (ref 0.44–1.00)
GFR calc Af Amer: >60 mL/min (ref >60)
GFR calc non Af Amer: 59 mL/min — ABNORMAL LOW (ref >60)
Glucose, Bld: 87 mg/dL (ref 65–99)
Potassium: 4.1 mmol/L (ref 3.5–5.1)
Sodium: 141 mmol/L (ref 135–145)
Total Bilirubin: 0.5 mg/dL (ref 0.3–1.2)
Total Protein: 8 g/dL (ref 6.5–8.1)

## 2017-07-24 LAB — CBC WITH DIFFERENTIAL/PLATELET
Basophils Absolute: 0 10*3/uL (ref 0–0.1)
Basophils Relative: 1 %
Eosinophils Absolute: 0.2 10*3/uL (ref 0–0.7)
Eosinophils Relative: 5 %
HCT: 40.4 % (ref 35.0–47.0)
Hemoglobin: 14 g/dL (ref 12.0–16.0)
Lymphocytes Relative: 44 %
Lymphs Abs: 2.1 10*3/uL (ref 1.0–3.6)
MCH: 32.5 pg (ref 26.0–34.0)
MCHC: 34.6 g/dL (ref 32.0–36.0)
MCV: 94 fL (ref 80.0–100.0)
Monocytes Absolute: 0.5 10*3/uL (ref 0.2–0.9)
Monocytes Relative: 11 %
Neutro Abs: 1.9 10*3/uL (ref 1.4–6.5)
Neutrophils Relative %: 39 %
Platelets: 235 10*3/uL (ref 150–440)
RBC: 4.3 MIL/uL (ref 3.80–5.20)
RDW: 13.6 % (ref 11.5–14.5)
WBC: 4.8 10*3/uL (ref 3.6–11.0)

## 2017-07-24 NOTE — Patient Instructions (Signed)
5-Hydroxyindoleacetic Acid Test Why am I having this test? This is a 24-hour urine collection test that measures a product of serotonin called 5-HIAA. This test is done to check the amount of 5-HIAA that your body is removing. This test helps to detect some types of tumors (carcinoid tumors) and certain other medical conditions. This test is also used to monitor the treatment of carcinoid tumors. What kind of sample is taken? A urine sample is collected in a sterile container that will be given to you by the lab to use at home before the test. How do I collect samples at home? You will be asked to collect a urine sample at home over a period of 24 hours. Follow your health care provider's instructions about how to collect and store your sample. How do I prepare for this test? Before the test, you will need to avoid certain foods, alcohol, prescriptions, and over-the-counter medications. Ask your health care provider how long you should follow these instructions before taking the test:  Do not eat foods that contain the hormone serotonin. This includes foods such as bananas, plantains, pineapple, kiwi, walnuts, plums, pecans, eggplant, tomatoes, and avocados. Do not eat those foods while taking the test either.  Do not drink alcohol.  Do not take prescriptions and over-the-counter medicines that interfere with the test. Ask your health care provider about changing or stopping your regular medicines.  What are the reference ranges? Reference ranges are considered healthy ranges established after testing a large group of healthy people. Reference ranges may vary among different people, labs, and hospitals. It is your responsibility to obtain your test results. Ask the lab or department performing the test when and how you will get your results. What do the results mean? The reference range for this test is 2-8 mg in a 24-hour period or 10-40 micromoles per day (SI units). Increased levels of 5-HIAA  may indicate:  The presence of a carcinoid tumor.  Cystic fibrosis.  Noncarcinoid illness.  Intestinal malabsorption.  If you already have a carcinoid tumor, an increased level of 5-HIAA may also indicate that your treatment is not working. Decreased levels of 5-HIAA may indicate:  Depression.  Migraine.  If you already have a carcinoid tumor, a decreased level of 5-HIAA may also indicate that your treatment is working. Talk with your health care provider to discuss your results, treatment options, and if necessary, the need for more tests. Talk with your health care provider if you have any questions about your results. Talk with your health care provider to discuss your results, treatment options, and if necessary, the need for more tests. Talk with your health care provider if you have any questions about your results. This information is not intended to replace advice given to you by your health care provider. Make sure you discuss any questions you have with your health care provider. Document Released: 06/05/2004 Document Revised: 01/16/2016 Document Reviewed: 10/09/2013 Elsevier Interactive Patient Education  [employer].

## 2017-07-24 NOTE — Progress Notes (Signed)
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  female with no significant past medical history-has been referred [place] new diagnosis of carcinoid of the small bowel.   Patient underwent screening colonoscopy when she had multiple polyps taken out; however she was also noted to have polypoid lesion in the terminal ileum that was biopsied-positive for small bowel carcinoid low-grade.  She has been referred to [place] further evaluation recommendations.   Patient denies any diarrhea.  Denies any flushing.  Denies any headaches.  Denies any wheezing.  No nausea vomiting.  No chest pain or shortness of the cough.  ROS: A complete 10 point review of system is done which is negative except mentioned above in history of present illness  MEDICAL HISTORY:  Past Medical History:  Diagnosis Date  . Former smoker   . Neuroendocrine carcinoma of small bowel ([facility]/27/2019  . Vertigo    no episodes in over 10 yrs    SURGICAL HISTORY: Past Surgical History:  Procedure Laterality Date  . COLONOSCOPY WITH PROPOFOL N/A 07/17/2017   Procedure: COLONOSCOPY WITH PROPOFOL;  Surgeon: VaLin LandsmanMD;  [facility];  Laterality: N/A;  . 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 Daughter- 3463 Social History   Socioeconomic History  . Marital status: Single    Spouse name: Not on file  . Number of children: Not on file  . Years of education: Not on file  . Highest education level: Not on file  Social Needs  . Financial resource strain: Somewhat hard  . Food insecurity - worry: Sometimes true  . Food insecurity - inability: Never true  . Transportation needs - medical: Patient refused  . Transportation needs - non-medical: Patient refused  Occupational History  . Occupation: Retired   Tobacco Use  . Smoking status: Light Tobacco Smoker    Packs/day: 0.25    Types: Cigarettes  . Smokeless tobacco: Never Used  . Tobacco comment: 1 pack every 1-2 weeks  Substance and Sexual Activity  . Alcohol use: Yes    Comment: 1-2 drinks/month  . Drug use: No  . Sexual activity: No  Other Topics Concern  . Not on file  Social History Narrative  . Not on file    FAMILY HISTORY: Family History  Problem Relation Age of Onset  . Stroke Mother   . Colon cancer Father   . Prostate cancer Father   . Breast cancer Maternal Aunt 70  . Liver cancer Maternal Aunt   . Stomach cancer Maternal Aunt     ALLERGIES:  has No Known Allergies.  MEDICATIONS:  Current Outpatient Medications  Medication Sig Dispense Refill  . Cholecalciferol (VITAMIN D3) 5000 units CAPS Take 1 capsule (5,000 Units total) by mouth daily. 30 capsule    No current facility-administered  medications for this visit.       Marland Kitchen  PHYSICAL EXAMINATION: ECOG PERFORMANCE STATUS: 0 - Asymptomatic  Vitals:   07/24/17 1510  BP: 136/78  Pulse: 82  Resp: 20  Temp: 97.8 F (36.6 C)   Filed Weights   07/24/17 1510  Weight: 162 lb 1.6 oz (73.5 kg)    GENERAL: Well-nourished well-developed; Alert, no distress and comfortable.   Alone.  EYES: no pallor or icterus OROPHARYNX: no thrush or ulceration; good dentition  NECK: supple, no masses felt LYMPH:  no palpable  lymphadenopathy in the cervical, axillary or inguinal regions LUNGS: clear to auscultation and  No wheeze or crackles HEART/CVS: regular rate & rhythm and no murmurs; No lower extremity edema ABDOMEN: abdomen soft, non-tender and normal bowel sounds Musculoskeletal:no cyanosis of digits and no clubbing  PSYCH: alert & oriented x 3 with fluent speech NEURO: no focal motor/sensory deficits SKIN:  no rashes or significant lesions  LABORATORY DATA:  I have reviewed the data as listed Lab Results  Component Value Date   WBC 4.8 07/24/2017   HGB 14.0 07/24/2017   HCT 40.4 07/24/2017   MCV 94.0 07/24/2017   PLT 235 07/24/2017   Recent Labs    06/14/17 1650 07/24/17 1535  NA 145* 141  K 4.7 4.1  CL 106 107  CO2 21 24  GLUCOSE 91 87  BUN 13 16  CREATININE 0.86 0.98  CALCIUM 9.9 9.1  GFRNONAA 71 59*  GFRAA 82 >60  PROT 7.6 8.0  ALBUMIN 4.5 4.1  AST 23 24  ALT 22 22  ALKPHOS 97 87  BILITOT 0.5 0.5    RADIOGRAPHIC STUDIES: I have personally reviewed the radiological images as listed and agreed with the findings in the report. Mm Outside [employer] Mammo  Result Date: 07/02/2017 This examination belongs to an outside facility and is stored here for comparison purposes only.  Contact the originating outside institution for any associated report or interpretation.   ASSESSMENT & PLAN:   Neuroendocrine carcinoma of small bowel (HCC) #Incidental diagnosis of small bowel/terminal ileum carcinoid low-grade.  Discussed with the patient that this seems to be isolated at this time; and seems to be nonfunctional carcinoid.  # I would recommend gallium dototate PET scan for further evaluation; for synchronous primaries; distant metastases unlikely.  However PET scan would be a good test.  Also discussed with Dr. Ilda Foil.  # family Hx-dad-colo/prostate [died at 11s]; 63 brothers/2 sisters- ""all had cancers".  Patient will be referred to genetic counseling/future  # smoking-discussed smoking  cessation test counseling  #We will cancel the CT scan we will schedule gallium PET scan.  Also check chromogranin A/urine 5 HIAA.   Thank you Dr. Marius Ditch for allowing me to participate in the care of your pleasant patient. Please do not hesitate to contact me with questions or concerns in the interim.  Discussed with Dr. Marius Ditch.      All questions were answered. The patient knows to call the clinic with any problems, questions or concerns.       Cammie Sickle, MD 07/30/2017 4:01 PM

## 2017-07-24 NOTE — Assessment & Plan Note (Addendum)
#  Incidental diagnosis of small bowel/terminal ileum carcinoid low-grade.  Discussed with the patient that this seems to be isolated at this time; and seems to be nonfunctional carcinoid.  # I would recommend gallium dototate PET scan for further evaluation; for synchronous primaries; distant metastases unlikely.  However PET scan would be a good test.  Also discussed with Dr. Ilda Foil.  If no distant metastatic disease noted recommend referral to surgery for resection  # family Hx-dad-colo/prostate [died at 59s]; 35 brothers/2 sisters- ""all had cancers".  Patient will be referred to genetic counseling/future  # smoking-discussed smoking cessation test counseling  #We will cancel the CT scan we will schedule gallium PET scan.  Also check chromogranin A/urine 5 HIAA.   Thank you Dr. Marius Ditch for allowing me to participate in the care of your pleasant patient. Please do not hesitate to contact me with questions or concerns in the interim.  Discussed with Dr. Marius Ditch.

## 2017-07-24 NOTE — Progress Notes (Signed)
Patient here for NP appointment. Pt is newly dx with neuroendocrine tumor. Patient has no medical concerns. She has a family h/o of colon cancer/prostate cancer.

## 2017-07-25 ENCOUNTER — Telehealth: Payer: Self-pay | Admitting: *Deleted

## 2017-07-25 NOTE — Telephone Encounter (Signed)
Contacted patient per md order. Discussed plan of care to cnl the ct scan on Monday and order a galium pet scan. Patient gave verbal understanding of the plan of care. She will be contacted by our scheduling team for the galium pet scan apt.

## 2017-07-26 LAB — CHROMOGRANIN A: Chromogranin A: 2 nmol/L (ref 0–5)

## 2017-07-28 DIAGNOSIS — Z8042 Family history of malignant neoplasm of prostate: Secondary | ICD-10-CM | POA: Diagnosis not present

## 2017-07-28 DIAGNOSIS — F17210 Nicotine dependence, cigarettes, uncomplicated: Secondary | ICD-10-CM | POA: Diagnosis not present

## 2017-07-28 DIAGNOSIS — C7A019 Malignant carcinoid tumor of the small intestine, unspecified portion: Secondary | ICD-10-CM | POA: Diagnosis not present

## 2017-07-28 DIAGNOSIS — Z803 Family history of malignant neoplasm of breast: Secondary | ICD-10-CM | POA: Diagnosis not present

## 2017-07-28 DIAGNOSIS — Z79899 Other long term (current) drug therapy: Secondary | ICD-10-CM | POA: Diagnosis not present

## 2017-07-28 DIAGNOSIS — Z800 Family history of malignant neoplasm of digestive organs: Secondary | ICD-10-CM | POA: Diagnosis not present

## 2017-07-28 DIAGNOSIS — K635 Polyp of colon: Secondary | ICD-10-CM | POA: Insufficient documentation

## 2017-07-28 DIAGNOSIS — E079 Disorder of thyroid, unspecified: Secondary | ICD-10-CM | POA: Diagnosis not present

## 2017-07-29 ENCOUNTER — Ambulatory Visit: Payer: PPO

## 2017-07-29 ENCOUNTER — Encounter: Payer: Self-pay | Admitting: Gastroenterology

## 2017-07-29 ENCOUNTER — Other Ambulatory Visit: Payer: Self-pay

## 2017-07-29 DIAGNOSIS — C7A8 Other malignant neuroendocrine tumors: Secondary | ICD-10-CM

## 2017-07-31 ENCOUNTER — Ambulatory Visit
Admission: RE | Admit: 2017-07-31 | Discharge: 2017-07-31 | Disposition: A | Discharge status: Home / Self Care | Payer: PPO | Source: Ambulatory Visit | Attending: Internal Medicine | Admitting: Internal Medicine

## 2017-07-31 DIAGNOSIS — C7A1 Malignant poorly differentiated neuroendocrine tumors: Secondary | ICD-10-CM | POA: Diagnosis not present

## 2017-07-31 DIAGNOSIS — C7A8 Other malignant neuroendocrine tumors: Secondary | ICD-10-CM

## 2017-07-31 DIAGNOSIS — E049 Nontoxic goiter, unspecified: Secondary | ICD-10-CM | POA: Diagnosis not present

## 2017-07-31 MED ORDER — GALLIUM GA 68 DOTATATE IV KIT
3.9700 | PACK | Freq: Once | INTRAVENOUS | Status: AC
  Administered 2017-07-31: 3.97 via INTRAVENOUS

## 2017-08-01 ENCOUNTER — Telehealth: Payer: Self-pay | Admitting: *Deleted

## 2017-08-01 LAB — 5 HIAA, QUANTITATIVE, URINE, 24 HOUR
5-HIAA, Ur: 1.8 mg/L
5-HIAA,Quant.,24 Hr Urine: 2.7 mg/24 hr (ref 0.0–14.9)
Total Volume: 1500

## 2017-08-01 NOTE — Telephone Encounter (Signed)
Called report   PET  IMPRESSION: 1. Intense somatostatin receptor activity in the distal ileum consistent with well differentiated neuroendocrine tumor. Small density evident on CT. 2. No evidence of metastatic nodules within the peritoneal space. No evidence of liver metastasis. No skeletal metastasis or distant metastatic disease. 3. Enlargement of the thyroid gland with massive substernal extension. 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 Recommend correlation with serum calcitonin levels and recommend thyroid biopsy to differentiate between medullary thyroid carcinoma and benign thyroid goiter.  These results will be called to the ordering clinician or representative by the Radiologist Assistant, and communication documented in the PACS or zVision Dashboard.   Electronically Signed   By: Suzy Bouchard M.D.   On: 07/31/2017 17:08

## 2017-08-02 ENCOUNTER — Other Ambulatory Visit: Payer: Self-pay | Admitting: Internal Medicine

## 2017-08-02 ENCOUNTER — Other Ambulatory Visit: Payer: PPO

## 2017-08-02 ENCOUNTER — Telehealth: Payer: Self-pay | Admitting: Internal Medicine

## 2017-08-02 DIAGNOSIS — C7A8 Other malignant neuroendocrine tumors: Secondary | ICD-10-CM

## 2017-08-02 DIAGNOSIS — E0789 Other specified disorders of thyroid: Secondary | ICD-10-CM

## 2017-08-02 DIAGNOSIS — E079 Disorder of thyroid, unspecified: Secondary | ICD-10-CM

## 2017-08-02 NOTE — Telephone Encounter (Signed)
Referrals has been placed. 

## 2017-08-02 NOTE — Telephone Encounter (Signed)
Labs- asap; referral to Dr.Byrnett [small bowel carcinoid]; Referral to [facility] [dx: thyroid mass]. Discussed at tumor conference.   discussed with pt/she agrees.

## 2017-08-05 ENCOUNTER — Inpatient Hospital Stay: Payer: PPO | Attending: Internal Medicine

## 2017-08-05 DIAGNOSIS — E0789 Other specified disorders of thyroid: Secondary | ICD-10-CM

## 2017-08-05 DIAGNOSIS — C7A019 Malignant carcinoid tumor of the small intestine, unspecified portion: Secondary | ICD-10-CM | POA: Diagnosis not present

## 2017-08-06 LAB — THYROID PANEL WITH TSH
Free Thyroxine Index: 1.9 (ref 1.2–4.9)
T3 Uptake Ratio: 28 % (ref 24–39)
T4, Total: 6.7 ug/dL (ref 4.5–12.0)
TSH: 0.682 u[IU]/mL (ref 0.450–4.500)

## 2017-08-06 LAB — CALCITONIN: Calcitonin: <2 pg/mL (ref 0.0–5.0)

## 2017-08-06 LAB — CEA: CEA: 4.8 ng/mL — ABNORMAL HIGH (ref 0.0–4.7)

## 2017-08-07 ENCOUNTER — Encounter: Payer: Self-pay | Admitting: Internal Medicine

## 2017-08-07 ENCOUNTER — Ambulatory Visit (INDEPENDENT_AMBULATORY_CARE_PROVIDER_SITE_OTHER): Payer: PPO | Admitting: General Surgery

## 2017-08-07 ENCOUNTER — Encounter: Payer: Self-pay | Admitting: General Surgery

## 2017-08-07 ENCOUNTER — Inpatient Hospital Stay (HOSPITAL_BASED_OUTPATIENT_CLINIC_OR_DEPARTMENT_OTHER): Payer: PPO | Admitting: Internal Medicine

## 2017-08-07 VITALS — BP 124/74 | HR 93 | Resp 14 | Ht 62.0 in | Wt 161.0 lb

## 2017-08-07 VITALS — BP 114/72 | HR 77 | Temp 97.9°F | Resp 16 | Wt 159.2 lb

## 2017-08-07 DIAGNOSIS — C7A8 Other malignant neuroendocrine tumors: Secondary | ICD-10-CM

## 2017-08-07 DIAGNOSIS — E049 Nontoxic goiter, unspecified: Secondary | ICD-10-CM | POA: Diagnosis not present

## 2017-08-07 DIAGNOSIS — Z800 Family history of malignant neoplasm of digestive organs: Secondary | ICD-10-CM

## 2017-08-07 DIAGNOSIS — Z8042 Family history of malignant neoplasm of prostate: Secondary | ICD-10-CM

## 2017-08-07 DIAGNOSIS — Z803 Family history of malignant neoplasm of breast: Secondary | ICD-10-CM | POA: Diagnosis not present

## 2017-08-07 DIAGNOSIS — E079 Disorder of thyroid, unspecified: Secondary | ICD-10-CM

## 2017-08-07 DIAGNOSIS — C7A019 Malignant carcinoid tumor of the small intestine, unspecified portion: Secondary | ICD-10-CM

## 2017-08-07 DIAGNOSIS — Z79899 Other long term (current) drug therapy: Secondary | ICD-10-CM

## 2017-08-07 DIAGNOSIS — K635 Polyp of colon: Secondary | ICD-10-CM

## 2017-08-07 DIAGNOSIS — F17210 Nicotine dependence, cigarettes, uncomplicated: Secondary | ICD-10-CM

## 2017-08-07 NOTE — Progress Notes (Signed)
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  female diagnosis of low-grade carcinoid of the small bowel; is here to review the results of her blood work/also imaging.  Patient continues to deny any diarrhea.  Denies any flushing.  Denies any headaches.  Denies any wheezing.  No nausea vomiting.  No chest pain or shortness of the cough.  Denies any difficulty swallowing.  Denies any palpitations.  ROS: A complete 10 point review of system is done which is negative except mentioned above in history of present illness  MEDICAL HISTORY:  Past Medical History:  Diagnosis Date  . Former smoker   . Neuroendocrine carcinoma of small bowel ([facility]27[facility]2019  . Vertigo    no episodes in over 10 yrs    SURGICAL HISTORY: Past Surgical History:  Procedure Laterality Date  . COLONOSCOPY WITH PROPOFOL N/A 07/17/2017   Procedure: COLONOSCOPY WITH PROPOFOL;  Surgeon: VaLin LandsmanMD;  [place];  Laterality: N/A;  . 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 Daughter- 3441 Social History   Socioeconomic History  . Marital status: Single    Spouse name: Not on file  . Number of children: Not on file  . Years of education: Not on file  . Highest education level: Not on file  Social Needs  . Financial resource strain: Somewhat hard  . Food insecurity - worry: Sometimes true  . Food insecurity - inability: Never true  . Transportation needs - medical: Patient refused  . Transportation needs - non-medical: Patient refused  Occupational History  . Occupation: Retired   Tobacco Use  . Smoking status: Light Tobacco Smoker    Packs/day: 0.25    Types: Cigarettes  . Smokeless tobacco: Never Used  . Tobacco comment: 1 pack every 1-2 weeks  Substance and Sexual Activity  . Alcohol use: Yes    Comment: 1-2 drinks/month  . Drug use: No  . Sexual activity: No  Other Topics Concern  . Not on file  Social History Narrative  . Not on file    FAMILY HISTORY: Family History  Problem Relation Age of Onset  . Stroke Mother   . Colon cancer Father   . Prostate cancer Father   . Breast cancer Maternal Aunt 70  . Liver cancer Maternal Aunt   . Stomach cancer Maternal Aunt     ALLERGIES:  has No Known Allergies.  MEDICATIONS:  Current Outpatient Medications  Medication Sig Dispense Refill  . Cholecalciferol (VITAMIN D3) 5000 units CAPS Take 1 capsule (5,000 Units total) by mouth daily. 30 capsule    No current facility-administered medications for this visit.       .Marland Kitchen  PHYSICAL EXAMINATION: ECOG PERFORMANCE STATUS: 0 - Asymptomatic  Vitals:   08/07/17 1002  BP: 114/72  Pulse: 77  Resp: 16  Temp: 97.9 F (36.6 C)   Filed Weights   08/07/17 1002  Weight: 159 lb 3.2 oz (72.2 kg)    GENERAL: Well-nourished well-developed; Alert, no distress and comfortable.   Alone.  EYES: no pallor or icterus OROPHARYNX: no thrush or ulceration; good dentition  NECK: supple, no masses felt LYMPH:  no palpable lymphadenopathy in  the cervical, axillary or inguinal regions LUNGS: clear to auscultation and  No wheeze or crackles HEART/CVS: regular rate & rhythm and no murmurs; No lower extremity edema ABDOMEN: abdomen soft, non-tender and normal bowel sounds Musculoskeletal:no cyanosis of digits and no clubbing  PSYCH: alert & oriented x 3 with fluent speech NEURO: no focal motor/sensory deficits SKIN:  no rashes or significant lesions  LABORATORY DATA:  I have reviewed the data as listed Lab Results  Component Value Date   WBC 4.8 07/24/2017   HGB 14.0 07/24/2017   HCT 40.4 07/24/2017   MCV 94.0 07/24/2017   PLT 235 07/24/2017   Recent Labs    06/14/17 1650 07/24/17 1535  NA 145* 141  K 4.7 4.1  CL 106 107  CO2 21 24  GLUCOSE 91 87  BUN 13 16  CREATININE 0.86 0.98  CALCIUM 9.9 9.1  GFRNONAA 71 59*  GFRAA 82 >60  PROT 7.6 8.0  ALBUMIN 4.5 4.1  AST 23 24  ALT 22 22  ALKPHOS 97 87  BILITOT 0.5 0.5    RADIOGRAPHIC STUDIES: I have personally reviewed the radiological images as listed and agreed with the findings in the report. Nm Pet (netspot Ga 68 Dotatate) Skull Base To Mid Thigh  Result Date: 07/31/2017 CLINICAL DATA:  Well differentiated neuroendocrine tumor the small intestine identified on colonoscopy. 3.97 EXAM: NUCLEAR MEDICINE PET SKULL BASE TO THIGH TECHNIQUE: 3.97 mCi Ga 35 DOTATATE was injected intravenously. Full-ring PET imaging was performed from the skull base to thigh after the radiotracer. CT data was obtained and used for attenuation correction and anatomic localization. Reference values: Spleen SUV max equal 21.8 Liver SUV max equal 8.0 COMPARISON:  None FINDINGS: NECK There is high radiotracer activity within the thyroid gland. The thyroid gland is enlarged and extends into the substernal location to the level of the inferior sternum. The activity is intense with SUV max equal 9.7. The mass measures 6.9 x 4.7 by 9.4 cm (volume = 160 cm^3). CHEST No radiotracer accumulation within  mediastinal or hilar lymph nodes. No suspicious pulmonary nodules on the CT scan. ABDOMEN/PELVIS There is intense radiotracer activity localizing to the terminal ileum with SUV max equal 15.3. This lesion is approximately 2 cm from the ileocecal valve. Small 1.5 cm density is present in the small bowel at this level (image 172, series 3). No mesenteric nodularity or mass. No focal abnormal accumulation of radiotracer within the liver. Physiologic activity noted adrenal glands, spleen and kidneys. Reference values: The SUV max of the spleen is equal to 21.8 and SUV max of the liver is equal to 7.8. SKELETON No focal activity to suggest skeletal metastasis. IMPRESSION: 1. Intense somatostatin receptor activity in the distal ileum consistent with well differentiated neuroendocrine tumor. Small density evident on CT. 2. No evidence of metastatic nodules within the peritoneal space. No evidence of liver metastasis. No skeletal metastasis or distant metastatic disease. 3. Enlargement of the thyroid gland with massive substernal extension. 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 Recommend correlation with serum calcitonin levels and recommend thyroid biopsy to differentiate between medullary thyroid carcinoma and benign thyroid goiter. These results will be called to the ordering clinician or representative by the Radiologist Assistant, and communication documented in the PACS or zVision Dashboard. Electronically Signed   By: Suzy Bouchard M.D.   On: 07/31/2017 17:08    ASSESSMENT & PLAN:   Neuroendocrine carcinoma of small bowel ([place]) # Incidental diagnosis of small bowel/terminal ileum carcinoid low-grade.  Discussed with the patient that this seems to be isolated at this time; and seems to be nonfunctional carcinoid [urine for 5-HIAA negative]  #  gallium dototate PET  scan- no distant metastases; shows a solitary terminal ileum uptake.  However,-diffuse thyroid uptake [ see discussion below]   # Thyroid uptake- ? Etiology; [facility] suspected-thyroid labs including calcitonin is normal; referral to endocrine.   #CEA slightly elevated at 4.8 normal 4.7; question smoking rather than malignancy.  We will continue monitor.  # family Hx-dad-colo/prostate [died at 78s]; 40 brothers/2 sisters- ""all had cancers".  Patient will be referred to genetic counseling/future  # smoking-discussed smoking cessation again.  # follow up in 2 months; no labs; referral to endocrine for thyroid mass.   Addendum: Discussed with Dr. Tollie Pizza; who after consultation with ENT-feels that patient's thyroid/retrosternal is compromising airway which needs to be addressed first for her anesthesia for bowel surgery.  Patient is being [facility] department    All questions were answered. The patient knows to call the clinic with any problems, questions or concerns.       Cammie Sickle, MD 08/08/2017 7:20 AM

## 2017-08-07 NOTE — Assessment & Plan Note (Addendum)
#   Incidental diagnosis of small bowel/terminal ileum carcinoid low-grade.  Discussed with the patient that this seems to be isolated at this time; and seems to be nonfunctional carcinoid [urine for 5-HIAA negative]  #  gallium dototate PET scan- no distant metastases; shows a solitary terminal ileum uptake.  However,-diffuse thyroid uptake [ see discussion below]   # Thyroid uptake- ? Etiology; [facility] suspected-thyroid labs including calcitonin is normal; referral to endocrine.   #CEA slightly elevated at 4.8 normal 4.7; question smoking rather than malignancy.  We will continue monitor.  # family Hx-dad-colo/prostate [died at 39s]; 51 brothers/2 sisters- ""all had cancers".  Patient will be referred to genetic counseling/future  # smoking-discussed smoking cessation again.  # follow up in 2 months; no labs; referral to endocrine for thyroid mass.   Addendum: Discussed with Dr. Tollie Pizza; who after consultation with ENT-feels that patient's thyroid/retrosternal is compromising airway which needs to be addressed first for her anesthesia for bowel surgery.  Patient is being referred to [facility] ENT department

## 2017-08-07 NOTE — Progress Notes (Signed)
Patient ID: Kristin Escobar, female   DOB: [birth year], 67 y.o.   MRN: [id number]  Chief Complaint  Patient presents with  . Other    HPI Kristin Escobar is a 67 y.o. female here today to discuss about Neuroendocrine carcinoma of small bowel . Patient had a colonoscopy on 07/17/2017. PET scan done on 07/03/2017.  Moves her bowels daily.    HPI  Past Medical History:  Diagnosis Date  . Former smoker   . 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 Vertigo    no episodes in over 10 yrs    Past Surgical History:  Procedure Laterality Date  . COLONOSCOPY WITH PROPOFOL N/A 07/17/2017   Procedure: COLONOSCOPY WITH PROPOFOL;  Surgeon: Lin Landsman, MD;  Location: [facility];  Service: Endoscopy;  Laterality: N/A;  . POLYPECTOMY  07/17/2017   Procedure: POLYPECTOMY;  Surgeon: Lin Landsman, MD;  Location: [place];  Service: Endoscopy;;    Family History  Problem Relation Age of Onset  . Stroke Mother   . Colon cancer Father   . Prostate cancer Father   . Breast cancer Maternal Aunt 70  . Liver cancer Maternal Aunt   . Stomach cancer Maternal Aunt     Social History Social History   Tobacco Use  . Smoking status: Light Tobacco Smoker    Packs/day: 0.25    Types: Cigarettes  . Smokeless tobacco: Never Used  . Tobacco comment: 1 pack every 1-2 weeks  Substance Use Topics  . Alcohol use: Yes    Comment: 1-2 drinks/month  . Drug use: No    No Known Allergies  Current Outpatient Medications  Medication Sig Dispense Refill  . Cholecalciferol (VITAMIN D3) 5000 units CAPS Take 1 capsule (5,000 Units total) by mouth daily. 30 capsule    No current facility-administered medications for this visit.     Review of Systems Review of Systems  Constitutional: Negative.  Negative for unexpected weight change.  Respiratory: Negative.        No change in exercise tolerance, no wheezing with activity.  Has up until the cold weather  walk 2-3 miles a day without difficulty.  Cardiovascular: Negative.   Gastrointestinal:       The patient denies dysphasia.  Occasional reflux.    Blood pressure 124/74, pulse 93, resp. rate 14, height '5\' 2"'$  (1.575 m), weight 161 lb (73 kg).  Physical Exam Physical Exam  Constitutional: She is oriented to person, place, and time. She appears well-developed and well-nourished.  Eyes: Conjunctivae are normal. No scleral icterus.  Neck: Neck supple.    Cardiovascular: Normal rate, regular rhythm and normal heart sounds.  Pulmonary/Chest: Effort normal and breath sounds normal.  Abdominal: Soft. Bowel sounds are normal. There is no hepatosplenomegaly. There is no tenderness.  Lymphadenopathy:    She has no cervical adenopathy.  Neurological: She is alert and oriented to person, place, and time.  Skin: Skin is warm and dry.    Data Reviewed PET scan of July 31, 2017: TECHNIQUE: 3.97 mCi Ga 68 DOTATATE was injected intravenously. Full-ring PET imaging was performed from the skull base to thigh after the radiotracer. CT data was obtained and used for attenuation correction and anatomic localization.  Reference values:  Spleen SUV max equal 21.8  Liver SUV max equal 8.0  COMPARISON:  None  FINDINGS: NECK  There is high radiotracer activity within the thyroid gland. The thyroid gland is enlarged and extends into the substernal location to  the level of the inferior sternum. The activity is intense with SUV max equal 9.7. The mass measures 6.9 x 4.7 by 9.4 cm (volume = 160 cm^3).  CHEST  No radiotracer accumulation within mediastinal or hilar lymph nodes. No suspicious pulmonary nodules on the CT scan.  ABDOMEN/PELVIS  There is intense radiotracer activity localizing to the terminal ileum with SUV max equal 15.3. This lesion is approximately 2 cm from the ileocecal valve. Small 1.5 cm density is present in the small bowel at this level (image 172, series 3).  No mesenteric nodularity or mass.  No focal abnormal accumulation of radiotracer within the liver.  Physiologic activity noted adrenal glands, spleen and kidneys.  Reference values: The SUV max of the spleen is equal to 21.8 and SUV max of the liver is equal to 7.8.  SKELETON  No focal activity to suggest skeletal metastasis.  IMPRESSION: 1. Intense somatostatin receptor activity in the distal ileum consistent with well differentiated neuroendocrine tumor. Small density evident on CT. 2. No evidence of metastatic nodules within the peritoneal space. No evidence of liver metastasis. No skeletal metastasis or distant metastatic disease. 3. Enlargement of the thyroid gland with massive substernal extension. 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 Recommend correlation with serum calcitonin levels and recommend thyroid biopsy to differentiate between    Review of these images is notable for marked displacement of the trachea to the right and posterior from the anterior mediastinal mass.  No loss of tracheal diameter.  Colonoscopy report of July 17, 2017 reviewed.  Pathology report from [facility] dated February 22: 2019: 1 tubular adenoma of the cecum; tubular adenoma of the descending colon; well-differentiated neuroendocrine tumor of the terminal ileum.  Mitotic count less than 1 per high-powered field.  Ki-67 rate less than 1.  Grade: Low.    Laboratory studies dated August 05, 2017 :  CEA elevated 0.1 over normal, 4.8.  Calcitonin:  Less than 2.0 (normal).  5-HIAA: 1.8, 24-hour urine: 2.7 mg, normal less than 15.  Chromagrafin A: 2 (0-5)  Thyroid function showed a TSH of 0.682, T4 total: 6.7; T3 uptake ratio 28; free thyroxine index: 1.9 (1.2-4.9)   Assessment    Neuroendocrine tumor involving  the terminal ileum without evidence of metastatic disease or biochemical activity    Plan  The PET scan was reviewed by ENT who recommended University referral for resection of the 6-7 cm retrosternal, anterior mediastinal mass most likely attached to the thyroid.  Tiburcio Pea, MD was recommended for assessment.  After the mediastinal mass issue is resolved, the patient would be a candidate for laparoscopically assisted right colectomy.  Dr. Rogue Bussing from medical oncology has been contacted by phone regarding ENT recommendations.  Patient is aware to call back for any questions or concerns.   HPI, Physical Exam, Assessment and Plan have been scribed under the direction and in the presence of Hervey Ard, MD.  Gaspar Cola, CMA  I have completed the exam and reviewed the above documentation for accuracy and completeness.  I agree with the above.  Haematologist has been used and any errors in dictation or transcription are unintentional.  Hervey Ard, M.D., F.A.C.S.  Forest Gleason Lyfe Reihl 08/08/2017, 7:02 AM

## 2017-08-07 NOTE — Patient Instructions (Signed)
Follow up appointment to be announced. The patient is aware to call back for any questions or concerns. 

## 2017-08-08 ENCOUNTER — Telehealth: Payer: Self-pay | Admitting: *Deleted

## 2017-08-08 DIAGNOSIS — E049 Nontoxic goiter, unspecified: Secondary | ICD-10-CM

## 2017-08-08 DIAGNOSIS — C7A8 Other malignant neuroendocrine tumors: Secondary | ICD-10-CM

## 2017-08-08 DIAGNOSIS — E041 Nontoxic single thyroid nodule: Secondary | ICD-10-CM | POA: Diagnosis not present

## 2017-08-08 HISTORY — DX: Nontoxic goiter, unspecified: E04.9

## 2017-08-08 NOTE — Telephone Encounter (Signed)
Patient has been scheduled for an appointment with Dr. Tiburcio Pea, MD at [facility] for 08-09-17 at 8:30 am (arrive 8:00 am). They are located at the [facility] (ground floor) [place] [address]. Phone: [phone] Fax: [phone].   The patient is aware to pick up the disc from the PET scan she had earlier this month done at [facility] and take with her to the appointment tomorrow. Patient is aware she will need her driver's license to pick up. Per [facility] radiology department, they will have disc ready for her to pick up today.   Patient verbalizes understanding of the above and appreciates the prompt appointment.

## 2017-08-08 NOTE — Telephone Encounter (Signed)
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 Dr. Anda Latina has recommended his evaluation.  Patient needs to be seen soon as she has a GI malignancy that cannot be addressed until thyroid issue is resolved.

## 2017-08-09 DIAGNOSIS — C7A019 Malignant carcinoid tumor of the small intestine, unspecified portion: Secondary | ICD-10-CM | POA: Diagnosis not present

## 2017-08-09 DIAGNOSIS — E049 Nontoxic goiter, unspecified: Secondary | ICD-10-CM | POA: Diagnosis not present

## 2017-08-09 DIAGNOSIS — Z6828 Body mass index (BMI) 28.0-28.9, adult: Secondary | ICD-10-CM | POA: Diagnosis not present

## 2017-08-09 DIAGNOSIS — F17210 Nicotine dependence, cigarettes, uncomplicated: Secondary | ICD-10-CM | POA: Diagnosis not present

## 2017-08-09 DIAGNOSIS — E041 Nontoxic single thyroid nodule: Secondary | ICD-10-CM | POA: Diagnosis not present

## 2017-08-13 ENCOUNTER — Telehealth: Payer: Self-pay | Admitting: Internal Medicine

## 2017-08-13 NOTE — Telephone Encounter (Signed)
Patient notified that she does not need an appt with endocrinology  - patient verbalized understanding.

## 2017-08-13 NOTE — Telephone Encounter (Signed)
Please inform patient that she can cancel endocrine consultation; as she is being treated at [facility] with surgery at this time.  Follow-up with me as planned

## 2017-08-13 NOTE — Telephone Encounter (Signed)
Colette, would you mind cancelling the referral to Dr. Gabriel Carina at endocrinology? Thank you!

## 2017-08-14 DIAGNOSIS — C7A8 Other malignant neuroendocrine tumors: Secondary | ICD-10-CM | POA: Diagnosis not present

## 2017-08-14 DIAGNOSIS — E049 Nontoxic goiter, unspecified: Secondary | ICD-10-CM | POA: Diagnosis not present

## 2017-08-26 DIAGNOSIS — Z720 Tobacco use: Secondary | ICD-10-CM | POA: Diagnosis not present

## 2017-08-26 DIAGNOSIS — J9811 Atelectasis: Secondary | ICD-10-CM | POA: Diagnosis not present

## 2017-08-26 DIAGNOSIS — D3A8 Other benign neuroendocrine tumors: Secondary | ICD-10-CM | POA: Diagnosis not present

## 2017-08-26 DIAGNOSIS — F17210 Nicotine dependence, cigarettes, uncomplicated: Secondary | ICD-10-CM | POA: Diagnosis not present

## 2017-08-26 DIAGNOSIS — R918 Other nonspecific abnormal finding of lung field: Secondary | ICD-10-CM | POA: Diagnosis not present

## 2017-08-26 DIAGNOSIS — E0500 Thyrotoxicosis with diffuse goiter without thyrotoxic crisis or storm: Secondary | ICD-10-CM | POA: Diagnosis not present

## 2017-08-26 DIAGNOSIS — Z4682 Encounter for fitting and adjustment of non-vascular catheter: Secondary | ICD-10-CM | POA: Diagnosis not present

## 2017-08-26 DIAGNOSIS — E890 Postprocedural hypothyroidism: Secondary | ICD-10-CM | POA: Diagnosis not present

## 2017-08-26 DIAGNOSIS — E892 Postprocedural hypoparathyroidism: Secondary | ICD-10-CM | POA: Diagnosis not present

## 2017-08-26 DIAGNOSIS — E049 Nontoxic goiter, unspecified: Secondary | ICD-10-CM | POA: Diagnosis not present

## 2017-08-26 HISTORY — PX: TOTAL THYROIDECTOMY: SHX2547

## 2017-08-29 MED ORDER — WHITE PETROLEUM JELLY EX GEL
1.00 | CUTANEOUS | Status: DC
Start: 2017-08-30 — End: 2017-08-29

## 2017-08-29 MED ORDER — ONDANSETRON HCL 4 MG/2ML IJ SOLN
4.00 mg | INTRAMUSCULAR | Status: DC
Start: ? — End: 2017-08-29

## 2017-08-29 MED ORDER — ACETAMINOPHEN 500 MG PO TABS
1000.00 mg | ORAL_TABLET | ORAL | Status: DC
Start: 2017-08-30 — End: 2017-08-29

## 2017-08-29 MED ORDER — SENNOSIDES 8.6 MG PO TABS
2.00 | ORAL_TABLET | ORAL | Status: DC
Start: ? — End: 2017-08-29

## 2017-08-29 MED ORDER — OXYCODONE HCL 5 MG PO TABS
5.00 mg | ORAL_TABLET | ORAL | Status: DC
Start: ? — End: 2017-08-29

## 2017-08-29 MED ORDER — OXYCODONE HCL 5 MG PO TABS
10.00 mg | ORAL_TABLET | ORAL | Status: DC
Start: ? — End: 2017-08-29

## 2017-08-29 MED ORDER — HEPARIN SODIUM (PORCINE) 5000 UNIT/ML IJ SOLN
5000.00 | INTRAMUSCULAR | Status: DC
Start: 2017-08-30 — End: 2017-08-29

## 2017-08-29 MED ORDER — LEVOTHYROXINE SODIUM 100 MCG PO TABS
100.00 | ORAL_TABLET | ORAL | Status: DC
Start: 2017-08-31 — End: 2017-08-29

## 2017-08-29 MED ORDER — DOCUSATE SODIUM 100 MG PO CAPS
100.00 mg | ORAL_CAPSULE | ORAL | Status: DC
Start: 2017-08-30 — End: 2017-08-29

## 2017-08-29 MED ORDER — CALCIUM CARBONATE 600 MG PO TABS
ORAL_TABLET | ORAL | Status: DC
Start: 2017-08-30 — End: 2017-08-29

## 2017-08-30 MED ORDER — MAGNESIUM HYDROXIDE 400 MG/5ML PO SUSP
30.00 | ORAL | Status: DC
Start: ? — End: 2017-08-30

## 2017-09-04 ENCOUNTER — Encounter: Payer: Self-pay | Admitting: *Deleted

## 2017-09-04 ENCOUNTER — Telehealth: Payer: Self-pay | Admitting: *Deleted

## 2017-09-04 ENCOUNTER — Other Ambulatory Visit: Payer: Self-pay | Admitting: *Deleted

## 2017-09-04 NOTE — Telephone Encounter (Signed)
-----   Message from Robert Bellow, MD sent at 09/04/2017  3:43 PM EDT ----- Patient had thyroid surgery at [facility] on April 1.  See how she is doing. We need to see her in 2-3 weeks to discuss surgery on her intestines.  Be sure she knows pathology from [facility] was OK. Thanks.

## 2017-09-04 NOTE — Patient Outreach (Signed)
[facility]) Care Management  09/04/2017  NIARA BUNKER Mar 12, 1951 [id number]  Ms. Mcglown was referred to [facility] for transition of care services subsequent to her recent hospitalization. Ms.Barreiro is a primary care patient at [facility] and as such her transition of care assessment will be completed by her primary care provider office who will refer to [facility] care mgmt if needed.  Plan: Case Closure.    [facility]  [phone]

## 2017-09-05 NOTE — Telephone Encounter (Signed)
This encounter was created in error - please disregard.

## 2017-09-11 DIAGNOSIS — Z85068 Personal history of other malignant neoplasm of small intestine: Secondary | ICD-10-CM | POA: Diagnosis not present

## 2017-09-11 DIAGNOSIS — E049 Nontoxic goiter, unspecified: Secondary | ICD-10-CM | POA: Diagnosis not present

## 2017-09-11 DIAGNOSIS — Z09 Encounter for follow-up examination after completed treatment for conditions other than malignant neoplasm: Secondary | ICD-10-CM | POA: Diagnosis not present

## 2017-09-11 DIAGNOSIS — Z8639 Personal history of other endocrine, nutritional and metabolic disease: Secondary | ICD-10-CM | POA: Diagnosis not present

## 2017-09-17 NOTE — Telephone Encounter (Signed)
Multiple calls to patient, and unable to reach, and she has not called [place] back. Dr Bary Castilla made aware.

## 2017-09-20 DIAGNOSIS — C7A8 Other malignant neuroendocrine tumors: Secondary | ICD-10-CM | POA: Insufficient documentation

## 2017-09-25 HISTORY — PX: LAPAROSCOPIC ILEOCECECTOMY: SHX5898

## 2017-09-26 ENCOUNTER — Telehealth: Payer: Self-pay | Admitting: *Deleted

## 2017-09-26 NOTE — Telephone Encounter (Signed)
-----   Message from Robert Bellow, MD sent at 09/26/2017  8:33 AM EDT ----- Please contact the patient and see if she will be having her GI surgery at [facility] or if she will be returning to [facility].  Thank you

## 2017-10-03 NOTE — Telephone Encounter (Signed)
She states she had thyroid surgery at [facility] so she will be having the GI surgery at [facility] as well. That surgery is for this Tuesday 10-09-17. Appreciates call.

## 2017-10-08 DIAGNOSIS — C7B01 Secondary carcinoid tumors of distant lymph nodes: Secondary | ICD-10-CM | POA: Diagnosis not present

## 2017-10-08 DIAGNOSIS — R109 Unspecified abdominal pain: Secondary | ICD-10-CM | POA: Diagnosis not present

## 2017-10-08 DIAGNOSIS — C7B04 Secondary carcinoid tumors of peritoneum: Secondary | ICD-10-CM | POA: Diagnosis not present

## 2017-10-08 DIAGNOSIS — E039 Hypothyroidism, unspecified: Secondary | ICD-10-CM | POA: Diagnosis not present

## 2017-10-08 DIAGNOSIS — Z800 Family history of malignant neoplasm of digestive organs: Secondary | ICD-10-CM | POA: Diagnosis not present

## 2017-10-08 DIAGNOSIS — E875 Hyperkalemia: Secondary | ICD-10-CM | POA: Diagnosis not present

## 2017-10-08 DIAGNOSIS — C7A012 Malignant carcinoid tumor of the ileum: Secondary | ICD-10-CM | POA: Diagnosis not present

## 2017-10-08 DIAGNOSIS — D3A012 Benign carcinoid tumor of the ileum: Secondary | ICD-10-CM | POA: Diagnosis not present

## 2017-10-08 DIAGNOSIS — F17210 Nicotine dependence, cigarettes, uncomplicated: Secondary | ICD-10-CM | POA: Diagnosis not present

## 2017-10-08 DIAGNOSIS — M6281 Muscle weakness (generalized): Secondary | ICD-10-CM | POA: Diagnosis not present

## 2017-10-08 DIAGNOSIS — E049 Nontoxic goiter, unspecified: Secondary | ICD-10-CM | POA: Diagnosis not present

## 2017-10-08 DIAGNOSIS — D3A00 Benign carcinoid tumor of unspecified site: Secondary | ICD-10-CM | POA: Diagnosis not present

## 2017-10-08 DIAGNOSIS — G8918 Other acute postprocedural pain: Secondary | ICD-10-CM | POA: Diagnosis not present

## 2017-10-08 DIAGNOSIS — Z9049 Acquired absence of other specified parts of digestive tract: Secondary | ICD-10-CM | POA: Diagnosis not present

## 2017-10-08 DIAGNOSIS — K639 Disease of intestine, unspecified: Secondary | ICD-10-CM | POA: Diagnosis not present

## 2017-10-09 ENCOUNTER — Ambulatory Visit: Payer: PPO | Admitting: Internal Medicine

## 2017-10-16 DIAGNOSIS — Z9889 Other specified postprocedural states: Secondary | ICD-10-CM | POA: Diagnosis not present

## 2017-10-16 DIAGNOSIS — E039 Hypothyroidism, unspecified: Secondary | ICD-10-CM | POA: Diagnosis not present

## 2017-10-16 DIAGNOSIS — E049 Nontoxic goiter, unspecified: Secondary | ICD-10-CM | POA: Diagnosis not present

## 2017-10-16 DIAGNOSIS — C7A8 Other malignant neuroendocrine tumors: Secondary | ICD-10-CM | POA: Diagnosis not present

## 2017-10-16 DIAGNOSIS — Z9089 Acquired absence of other organs: Secondary | ICD-10-CM | POA: Diagnosis not present

## 2017-10-18 DIAGNOSIS — C7A098 Malignant carcinoid tumors of other sites: Secondary | ICD-10-CM | POA: Diagnosis not present

## 2017-10-18 DIAGNOSIS — E892 Postprocedural hypoparathyroidism: Secondary | ICD-10-CM | POA: Diagnosis not present

## 2017-10-18 DIAGNOSIS — Z9889 Other specified postprocedural states: Secondary | ICD-10-CM | POA: Diagnosis not present

## 2017-10-18 DIAGNOSIS — C7A012 Malignant carcinoid tumor of the ileum: Secondary | ICD-10-CM | POA: Diagnosis not present

## 2017-12-23 ENCOUNTER — Telehealth: Payer: Self-pay | Admitting: Obstetrics & Gynecology

## 2017-12-23 NOTE — Telephone Encounter (Signed)
Called and spoke with patient to schedule annual with Dr. Kenton Kingfisher. Patient has had an recent surgery and wishes to call back to be schedule

## 2017-12-23 NOTE — Telephone Encounter (Signed)
-----   Message from Gae Dry, MD sent at 12/21/2017  9:48 AM EDT ----- Regarding: sch annual [place] Annual w [place] (overdue)

## 2017-12-25 DIAGNOSIS — H43812 Vitreous degeneration, left eye: Secondary | ICD-10-CM | POA: Diagnosis not present

## 2017-12-25 DIAGNOSIS — H25013 Cortical age-related cataract, bilateral: Secondary | ICD-10-CM | POA: Diagnosis not present

## 2017-12-25 DIAGNOSIS — H2513 Age-related nuclear cataract, bilateral: Secondary | ICD-10-CM | POA: Diagnosis not present

## 2018-01-09 DIAGNOSIS — C7A019 Malignant carcinoid tumor of the small intestine, unspecified portion: Secondary | ICD-10-CM | POA: Diagnosis not present

## 2018-01-09 DIAGNOSIS — Z9889 Other specified postprocedural states: Secondary | ICD-10-CM | POA: Diagnosis not present

## 2018-01-09 DIAGNOSIS — Z9049 Acquired absence of other specified parts of digestive tract: Secondary | ICD-10-CM | POA: Diagnosis not present

## 2018-01-09 DIAGNOSIS — Z9089 Acquired absence of other organs: Secondary | ICD-10-CM | POA: Diagnosis not present

## 2018-01-09 DIAGNOSIS — Z980 Intestinal bypass and anastomosis status: Secondary | ICD-10-CM | POA: Diagnosis not present

## 2018-01-17 DIAGNOSIS — Z859 Personal history of malignant neoplasm, unspecified: Secondary | ICD-10-CM | POA: Diagnosis not present

## 2018-01-17 DIAGNOSIS — Z08 Encounter for follow-up examination after completed treatment for malignant neoplasm: Secondary | ICD-10-CM | POA: Diagnosis not present

## 2018-01-17 DIAGNOSIS — Z6828 Body mass index (BMI) 28.0-28.9, adult: Secondary | ICD-10-CM | POA: Diagnosis not present

## 2018-01-17 DIAGNOSIS — C7A8 Other malignant neuroendocrine tumors: Secondary | ICD-10-CM | POA: Diagnosis not present

## 2018-01-24 ENCOUNTER — Encounter: Payer: Self-pay | Admitting: Internal Medicine

## 2018-01-24 ENCOUNTER — Ambulatory Visit (INDEPENDENT_AMBULATORY_CARE_PROVIDER_SITE_OTHER): Payer: PPO | Admitting: Internal Medicine

## 2018-01-24 VITALS — BP 110/78 | HR 81 | Ht 62.0 in | Wt 158.0 lb

## 2018-01-24 DIAGNOSIS — R630 Anorexia: Secondary | ICD-10-CM

## 2018-01-24 DIAGNOSIS — E890 Postprocedural hypothyroidism: Secondary | ICD-10-CM

## 2018-01-24 DIAGNOSIS — B977 Papillomavirus as the cause of diseases classified elsewhere: Secondary | ICD-10-CM | POA: Diagnosis not present

## 2018-01-24 DIAGNOSIS — Z8589 Personal history of malignant neoplasm of other organs and systems: Secondary | ICD-10-CM

## 2018-01-24 DIAGNOSIS — F17201 Nicotine dependence, unspecified, in remission: Secondary | ICD-10-CM | POA: Diagnosis not present

## 2018-01-24 NOTE — Progress Notes (Signed)
Date:  01/24/2018   Name:  Kristin Escobar   DOB:  06/20/1950   MRN:  [id number]   Chief Complaint: Depression Depression         The problem has been resolved since onset.  Associated symptoms include appetite change.  Associated symptoms include no fatigue and no headaches.  Past treatments include nothing.  Compliance with prior treatments: was prescribed wellbutrin to help with smoking and mood but never took it.  Past medical history includes thyroid problem.   Thyroid Problem  Presents for follow-up (had total thyroidectomy in april for massive goiter but no malignancy) visit. Symptoms include diarrhea. Patient reports no anxiety, fatigue or palpitations. The symptoms have been stable.   S/p partial colectomy for neuroendocrine tumor - doing well but no appetite, loose stools.  Just started questran to help with stools.  Eating mostly fruit - nothing else appeals to her.  She has lost about 7 lbs since April.    Review of Systems  Constitutional: Positive for appetite change. Negative for chills, fatigue and fever.  Respiratory: Negative for chest tightness, shortness of breath and wheezing.   Cardiovascular: Negative for chest pain and palpitations.  Gastrointestinal: Positive for diarrhea. Negative for abdominal pain.  Genitourinary: Negative for dysuria.  Musculoskeletal: Negative for arthralgias and gait problem.  Neurological: Negative for dizziness and headaches.  Psychiatric/Behavioral: Positive for depression. Negative for dysphoric mood and sleep disturbance. The patient is not nervous/anxious.     Patient Active Problem List   Diagnosis Date Noted  . Thyroid goiter 08/08/2017  . 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 Colon cancer screening   . Smoker 07/12/2017  . Vitamin D deficiency 06/14/2017  . Gait instability 06/14/2017  . Hyperlipidemia 06/14/2017  . Depression 06/14/2017  . Insomnia 06/14/2017  . Obesity (BMI 30.0-34.9) 06/14/2017   . Prediabetes 06/14/2017  . 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 Nocturnal leg cramps 06/14/2017    No Known Allergies  Past Surgical History:  Procedure Laterality Date  . COLONOSCOPY WITH PROPOFOL N/A 07/17/2017   Procedure: COLONOSCOPY WITH PROPOFOL;  Surgeon: Lin Landsman, MD;  Location: [place];  Service: Endoscopy;  Laterality: N/A;  . LAPAROSCOPIC ILEOCECECTOMY  09/2017   for carcinoid tumor  . POLYPECTOMY  07/17/2017   Procedure: POLYPECTOMY;  Surgeon: Lin Landsman, MD;  Location: [place];  Service: Endoscopy;;  . TOTAL THYROIDECTOMY  08/2017   no carcinoma    Social History   Tobacco Use  . Smoking status: Former Smoker    Packs/day: 0.25    Types: Cigarettes    Last attempt to quit: 08/09/2017    Years since quitting: 0.4  . Smokeless tobacco: Never Used  Substance Use Topics  . Alcohol use: Yes    Comment: 1-2 drinks/month  . Drug use: No     Medication list has been reviewed and updated.  Current Meds  Medication Sig  . Cholecalciferol (VITAMIN D3) 5000 units CAPS Take 1 capsule (5,000 Units total) by mouth daily.  . cholestyramine (QUESTRAN) 4 g packet Take by mouth.  . levothyroxine (SYNTHROID, LEVOTHROID) 100 MCG tablet Take 100 mcg by mouth daily before breakfast.  . Multiple Vitamins-Minerals (MULTIVITAMIN WITH MINERALS) tablet Take by mouth.  . vitamin B-12 (CYANOCOBALAMIN) 100 MCG tablet Take by mouth.    PHQ 2/9 Scores 01/24/2018 06/14/2017  PHQ - 2 Score 0 2  PHQ- 9 Score 3 8    Physical Exam  Constitutional: She is oriented  to person, place, and time. She appears well-developed. No distress.  HENT:  Head: Normocephalic and atraumatic.  Neck: Normal range of motion. Neck supple.  Cardiovascular: Normal rate, regular rhythm and normal heart sounds.  Pulmonary/Chest: Effort normal and breath sounds normal. No respiratory distress.  Musculoskeletal: Normal range of motion.  Neurological: She is alert and oriented  to person, place, and time.  Skin: Skin is warm and dry. No rash noted.  Pale flat macules on sides of both 5 fingers  Psychiatric: She has a normal mood and affect. Her behavior is normal. Thought content normal.  Nursing note and vitals reviewed.   BP 110/78 (BP Location: Right Arm, Patient Position: Sitting, Cuff Size: Normal)   Pulse 81   Ht 5\' 2"  (1.575 m)   Wt 158 lb (71.7 kg)   SpO2 97%   BMI 28.90 kg/m   Assessment and Plan: 1. History of neuroendocrine cancer Recovering but with residual diarrhea - continue Questran  2. Postoperative hypothyroidism Supplemented - due for recheck - TSH  3. HPV (human papilloma virus) infection In may 2018 with normal Pap - recommend follow up with GYN until HPV cleared  4. Decrease in appetite Recommend increase in protein for energy Samples of Ensure given - recommend using to supplement nutrition  5. Tobacco use disorder, severe, in early remission Pt congratulated on quitting   No orders of the defined types were placed in this encounter.   Partially dictated using Editor, commissioning. Any errors are unintentional.  Halina Maidens, MD [facility]  01/24/2018

## 2018-01-25 LAB — TSH: TSH: 0.303 u[IU]/mL — ABNORMAL LOW (ref 0.450–4.500)

## 2018-01-28 DIAGNOSIS — E049 Nontoxic goiter, unspecified: Secondary | ICD-10-CM | POA: Diagnosis not present

## 2018-01-28 DIAGNOSIS — E039 Hypothyroidism, unspecified: Secondary | ICD-10-CM | POA: Diagnosis not present

## 2018-01-29 ENCOUNTER — Other Ambulatory Visit: Payer: Self-pay | Admitting: Internal Medicine

## 2018-01-29 DIAGNOSIS — D485 Neoplasm of uncertain behavior of skin: Secondary | ICD-10-CM

## 2018-01-30 DIAGNOSIS — C7A8 Other malignant neuroendocrine tumors: Secondary | ICD-10-CM | POA: Diagnosis not present

## 2018-02-10 DIAGNOSIS — B078 Other viral warts: Secondary | ICD-10-CM | POA: Diagnosis not present

## 2018-02-14 DIAGNOSIS — C7A8 Other malignant neuroendocrine tumors: Secondary | ICD-10-CM | POA: Diagnosis not present

## 2018-02-14 DIAGNOSIS — Z6829 Body mass index (BMI) 29.0-29.9, adult: Secondary | ICD-10-CM | POA: Diagnosis not present

## 2018-02-17 ENCOUNTER — Other Ambulatory Visit: Payer: Self-pay | Admitting: Internal Medicine

## 2018-02-17 ENCOUNTER — Telehealth: Payer: Self-pay

## 2018-02-17 DIAGNOSIS — E890 Postprocedural hypothyroidism: Secondary | ICD-10-CM

## 2018-02-17 MED ORDER — LEVOTHYROXINE SODIUM 88 MCG PO TABS: 88.0000 ug | ORAL_TABLET | Freq: Every day | ORAL | 1 refills | Status: DC

## 2018-02-17 NOTE — Telephone Encounter (Signed)
Patient called stating she is about to run out of her current dose of Levothyroxine and needs refill of lower dose sent in ( per last labs ).  Please Advise.  Pharm[facility]: [facility].

## 2018-02-17 NOTE — Telephone Encounter (Signed)
Done.  Changed the dose to 88 mcg per day.

## 2018-02-17 NOTE — Telephone Encounter (Signed)
Patient informed. 

## 2018-04-18 DIAGNOSIS — Z980 Intestinal bypass and anastomosis status: Secondary | ICD-10-CM | POA: Diagnosis not present

## 2018-04-18 DIAGNOSIS — E049 Nontoxic goiter, unspecified: Secondary | ICD-10-CM | POA: Diagnosis not present

## 2018-04-18 DIAGNOSIS — Z9009 Acquired absence of other part of head and neck: Secondary | ICD-10-CM | POA: Diagnosis not present

## 2018-04-18 DIAGNOSIS — E890 Postprocedural hypothyroidism: Secondary | ICD-10-CM | POA: Diagnosis not present

## 2018-04-18 DIAGNOSIS — Z8589 Personal history of malignant neoplasm of other organs and systems: Secondary | ICD-10-CM | POA: Diagnosis not present

## 2018-04-18 DIAGNOSIS — N644 Mastodynia: Secondary | ICD-10-CM | POA: Diagnosis not present

## 2018-04-18 DIAGNOSIS — R0789 Other chest pain: Secondary | ICD-10-CM | POA: Diagnosis not present

## 2018-04-18 DIAGNOSIS — C7A8 Other malignant neuroendocrine tumors: Secondary | ICD-10-CM | POA: Diagnosis not present

## 2018-04-18 DIAGNOSIS — Z79899 Other long term (current) drug therapy: Secondary | ICD-10-CM | POA: Diagnosis not present

## 2018-04-18 DIAGNOSIS — E039 Hypothyroidism, unspecified: Secondary | ICD-10-CM | POA: Diagnosis not present

## 2018-04-18 DIAGNOSIS — Z6830 Body mass index (BMI) 30.0-30.9, adult: Secondary | ICD-10-CM | POA: Diagnosis not present

## 2018-07-25 DIAGNOSIS — Z08 Encounter for follow-up examination after completed treatment for malignant neoplasm: Secondary | ICD-10-CM | POA: Diagnosis not present

## 2018-07-25 DIAGNOSIS — Z6830 Body mass index (BMI) 30.0-30.9, adult: Secondary | ICD-10-CM | POA: Diagnosis not present

## 2018-07-25 DIAGNOSIS — C7A012 Malignant carcinoid tumor of the ileum: Secondary | ICD-10-CM | POA: Diagnosis not present

## 2018-07-25 DIAGNOSIS — Z859 Personal history of malignant neoplasm, unspecified: Secondary | ICD-10-CM | POA: Diagnosis not present

## 2018-08-15 ENCOUNTER — Other Ambulatory Visit: Payer: Self-pay

## 2018-08-15 DIAGNOSIS — E890 Postprocedural hypothyroidism: Secondary | ICD-10-CM

## 2018-08-15 MED ORDER — LEVOTHYROXINE SODIUM 88 MCG PO TABS: 88.0000 ug | ORAL_TABLET | Freq: Every day | ORAL | 1 refills | Status: DC

## 2018-08-22 ENCOUNTER — Encounter: Payer: PPO | Admitting: Internal Medicine

## 2018-10-06 ENCOUNTER — Encounter: Payer: Self-pay | Admitting: Internal Medicine

## 2018-10-07 ENCOUNTER — Ambulatory Visit (INDEPENDENT_AMBULATORY_CARE_PROVIDER_SITE_OTHER): Payer: PPO | Admitting: Internal Medicine

## 2018-10-07 ENCOUNTER — Other Ambulatory Visit: Payer: Self-pay

## 2018-10-07 ENCOUNTER — Encounter: Payer: Self-pay | Admitting: Internal Medicine

## 2018-10-07 VITALS — BP 114/78 | HR 73 | Ht 62.0 in | Wt 171.0 lb

## 2018-10-07 DIAGNOSIS — E785 Hyperlipidemia, unspecified: Secondary | ICD-10-CM

## 2018-10-07 DIAGNOSIS — Z1159 Encounter for screening for other viral diseases: Secondary | ICD-10-CM | POA: Diagnosis not present

## 2018-10-07 DIAGNOSIS — R7303 Prediabetes: Secondary | ICD-10-CM | POA: Diagnosis not present

## 2018-10-07 DIAGNOSIS — Z8589 Personal history of malignant neoplasm of other organs and systems: Secondary | ICD-10-CM | POA: Diagnosis not present

## 2018-10-07 DIAGNOSIS — Z0000 Encounter for general adult medical examination without abnormal findings: Secondary | ICD-10-CM

## 2018-10-07 DIAGNOSIS — Z1231 Encounter for screening mammogram for malignant neoplasm of breast: Secondary | ICD-10-CM | POA: Diagnosis not present

## 2018-10-07 DIAGNOSIS — E559 Vitamin D deficiency, unspecified: Secondary | ICD-10-CM

## 2018-10-07 DIAGNOSIS — E890 Postprocedural hypothyroidism: Secondary | ICD-10-CM | POA: Diagnosis not present

## 2018-10-07 DIAGNOSIS — Z13820 Encounter for screening for osteoporosis: Secondary | ICD-10-CM | POA: Diagnosis not present

## 2018-10-07 DIAGNOSIS — Z23 Encounter for immunization: Secondary | ICD-10-CM

## 2018-10-07 LAB — POCT URINALYSIS DIPSTICK
Bilirubin, UA: NEGATIVE
Glucose, UA: NEGATIVE
Ketones, UA: NEGATIVE
Nitrite, UA: NEGATIVE
Protein, UA: NEGATIVE
Spec Grav, UA: 1.015 (ref 1.010–1.025)
Urobilinogen, UA: 0.2 E.U./dL
pH, UA: 5 (ref 5.0–8.0)

## 2018-10-07 NOTE — Progress Notes (Signed)
Date:  10/07/2018   Name:  Kristin Escobar   DOB:  [birth year]   MRN:  [id number]   Chief Complaint: Annual Exam (Patient has obgyn. ) Kristin Escobar is a 68 y.o. female who presents today for her Complete Annual Exam. She feels fairly well. She reports exercising some. She reports she is sleeping fairly well.  She is due for Mammogram, DEXA, PPV-23 and Hep C testing. Colonoscopy 2019 - due for follow up of small bowel cancer.  PET is planned in November. Thyroid Problem  Presents for follow-up visit. Patient reports no anxiety, constipation, diarrhea, fatigue, palpitations or tremors. The symptoms have been stable.  Diabetes  She presents for her follow-up diabetic visit. Diabetes type: prediabetes. Her disease course has been stable. Pertinent negatives for hypoglycemia include no dizziness, headaches, nervousness/anxiousness or tremors. Pertinent negatives for diabetes include no chest pain, no fatigue, no polydipsia and no polyuria.    Small bowel tumor - she has chronic diarrhea after each meal.  She has not responded to imodium, or questran.  Her weight is up from last year.  She does feel bloated and passes gas without change in sx.  She was advised to try simethicone but did not.  She tried Pepto bismol in stead.  Lab Results  Component Value Date   HGBA1C 5.8 (H) 06/14/2017   Lab Results  Component Value Date   CREATININE 0.98 07/24/2017   BUN 16 07/24/2017   NA 141 07/24/2017   K 4.1 07/24/2017   CL 107 07/24/2017   CO2 24 07/24/2017   Lab Results  Component Value Date   CHOL 242 (H) 06/14/2017   HDL 49 06/14/2017   LDLCALC 151 (H) 06/14/2017   TRIG 209 (H) 06/14/2017   CHOLHDL 4.9 (H) 06/14/2017     Review of Systems  Constitutional: Negative for chills, fatigue and fever.  HENT: Negative for congestion, hearing loss, tinnitus, trouble swallowing and voice change.   Eyes: Negative for visual disturbance.  Respiratory: Negative for cough, chest tightness,  shortness of breath and wheezing.   Cardiovascular: Negative for chest pain, palpitations and leg swelling.  Gastrointestinal: Positive for abdominal distention. Negative for abdominal pain, constipation, diarrhea, nausea and vomiting.  Endocrine: Negative for polydipsia and polyuria.  Genitourinary: Negative for dysuria, frequency, genital sores, vaginal bleeding and vaginal discharge.  Musculoskeletal: Negative for arthralgias, gait problem and joint swelling.  Skin: Negative for color change and rash.  Allergic/Immunologic: Negative for environmental allergies.  Neurological: Negative for dizziness, tremors, light-headedness and headaches.  Hematological: Negative for adenopathy. Does not bruise/bleed easily.  Psychiatric/Behavioral: Negative for dysphoric mood and sleep disturbance. The patient is not nervous/anxious.     Patient Active Problem List   Diagnosis Date Noted  . Postoperative hypothyroidism 01/24/2018  . Goiter 08/08/2017  . History of neuroendocrine cancer 07/24/2017  . Colon cancer screening   . Tobacco use disorder 07/12/2017  . Vitamin D deficiency 06/14/2017  . Gait instability 06/14/2017  . Hyperlipidemia 06/14/2017  . Depression 06/14/2017  . Insomnia 06/14/2017  . Obesity (BMI 30.0-34.9) 06/14/2017  . Prediabetes 06/14/2017  . 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 Nocturnal leg cramps 06/14/2017    No Known Allergies  Past Surgical History:  Procedure Laterality Date  . COLONOSCOPY WITH PROPOFOL N/A 07/17/2017   Procedure: COLONOSCOPY WITH PROPOFOL;  Surgeon: Lin Landsman, MD;  Location: [place];  Service: Endoscopy;  Laterality: N/A;  . LAPAROSCOPIC ILEOCECECTOMY  09/2017   neuroendocrine tumor  .  POLYPECTOMY  07/17/2017   Procedure: POLYPECTOMY;  Surgeon: Lin Landsman, MD;  Location: [place];  Service: Endoscopy;;  . TOTAL THYROIDECTOMY  08/2017   no carcinoma    Social History   Tobacco Use   . Smoking status: Current Some Day Smoker    Packs/day: 0.25    Years: 15.00    Pack years: 3.75    Types: Cigarettes    Last attempt to quit: 08/09/2017    Years since quitting: 1.1  . Smokeless tobacco: Never Used  Substance Use Topics  . Alcohol use: Yes    Comment: 1-2 drinks/month  . Drug use: No     Medication list has been reviewed and updated.  Current Meds  Medication Sig  . Cholecalciferol (VITAMIN D3) 5000 units CAPS Take 1 capsule (5,000 Units total) by mouth daily.  . cholestyramine (QUESTRAN) 4 g packet Take by mouth.  . levothyroxine (SYNTHROID, LEVOTHROID) 88 MCG tablet Take 1 tablet (88 mcg total) by mouth daily before breakfast.  . Multiple Vitamins-Minerals (MULTIVITAMIN WITH MINERALS) tablet Take by mouth.  . vitamin B-12 (CYANOCOBALAMIN) 100 MCG tablet Take by mouth.    PHQ 2/9 Scores 10/07/2018 01/24/2018 06/14/2017  PHQ - 2 Score 0 0 2  PHQ- 9 Score - 3 8    BP Readings from Last 3 Encounters:  10/07/18 114/78  01/24/18 110/78  08/07/17 124/74    Physical Exam Vitals signs and nursing note reviewed.  Constitutional:      General: She is not in acute distress.    Appearance: She is well-developed.  HENT:     Head: Normocephalic and atraumatic.     Right Ear: Tympanic membrane and ear canal normal.     Left Ear: Tympanic membrane and ear canal normal.     Nose:     Right Sinus: No maxillary sinus tenderness.     Left Sinus: No maxillary sinus tenderness.     Mouth/Throat:     Pharynx: Uvula midline.  Eyes:     General: No scleral icterus.       Right eye: No discharge.        Left eye: No discharge.     Conjunctiva/sclera: Conjunctivae normal.  Neck:     Musculoskeletal: Normal range of motion. No erythema.     Thyroid: No thyromegaly.     Vascular: No carotid bruit.  Cardiovascular:     Rate and Rhythm: Normal rate and regular rhythm.     Pulses: Normal pulses.     Heart sounds: Normal heart sounds.  Pulmonary:     Effort: Pulmonary  effort is normal. No respiratory distress.     Breath sounds: No wheezing.  Chest:     Breasts:        Right: No mass, nipple discharge, skin change or tenderness.        Left: No mass, nipple discharge, skin change or tenderness.  Abdominal:     General: Bowel sounds are normal.     Palpations: Abdomen is soft.     Tenderness: There is no abdominal tenderness.  Musculoskeletal: Normal range of motion.  Lymphadenopathy:     Cervical: No cervical adenopathy.  Skin:    General: Skin is warm and dry.     Findings: No rash.  Neurological:     Mental Status: She is alert and oriented to person, place, and time.     Cranial Nerves: No cranial nerve deficit.     Sensory: No sensory deficit.  Deep Tendon Reflexes: Reflexes are normal and symmetric.  Psychiatric:        Speech: Speech normal.        Behavior: Behavior normal.        Thought Content: Thought content normal.     Wt Readings from Last 3 Encounters:  10/07/18 171 lb (77.6 kg)  01/24/18 158 lb (71.7 kg)  08/07/17 161 lb (73 kg)    BP 114/78   Pulse 73   Ht 5\' 2"  (1.575 m)   Wt 171 lb (77.6 kg)   SpO2 94%   BMI 31.28 kg/m   Assessment and Plan: 1. Annual physical exam Normal exam except for weight Discussed regular exercise - CBC with Differential/Platelet - Comprehensive metabolic panel - POCT urinalysis dipstick  2. Encounter for screening mammogram for breast cancer To be scheduled at [place]; Future  3. Encounter for screening for osteoporosis To be scheduled at [place]; Future  4. Need for vaccination for pneumococcus - Pneumococcal polysaccharide vaccine 23-valent greater than or equal to 2yo subcutaneous/IM  5. Need for hepatitis C screening test  6. Postoperative hypothyroidism Supplemented; adjust if needed - TSH + free T4  7. Hyperlipidemia, unspecified hyperlipidemia type Check labs and advise - Lipid panel  8. Prediabetes Weight  control and diet changes discussed - Hemoglobin A1c  9. Vitamin D deficiency - VITAMIN D 25 Hydroxy (Vit-D Deficiency, Fractures)  10. History of neuroendocrine cancer Due for one year colonoscopy - Ambulatory referral to Gastroenterology   Partially dictated using [place]. Any errors are unintentional.  Halina Maidens, MD [facility]  10/07/2018

## 2018-10-08 ENCOUNTER — Telehealth: Payer: Self-pay

## 2018-10-08 LAB — COMPREHENSIVE METABOLIC PANEL
ALT: 26 IU/L (ref 0–32)
AST: 27 IU/L (ref 0–40)
Albumin/Globulin Ratio: 1.7 (ref 1.2–2.2)
Albumin: 4.4 g/dL (ref 3.8–4.8)
Alkaline Phosphatase: 84 IU/L (ref 39–117)
BUN/Creatinine Ratio: 16 (ref 12–28)
BUN: 14 mg/dL (ref 8–27)
Bilirubin Total: 0.3 mg/dL (ref 0.0–1.2)
CO2: 25 mmol/L (ref 20–29)
Calcium: 9.2 mg/dL (ref 8.7–10.3)
Chloride: 102 mmol/L (ref 96–106)
Creatinine, Ser: 0.86 mg/dL (ref 0.57–1.00)
GFR calc Af Amer: 81 mL/min/{1.73_m2} (ref >59)
GFR calc non Af Amer: 70 mL/min/{1.73_m2} (ref >59)
Globulin, Total: 2.6 g/dL (ref 1.5–4.5)
Glucose: 93 mg/dL (ref 65–99)
Potassium: 4.1 mmol/L (ref 3.5–5.2)
Sodium: 140 mmol/L (ref 134–144)
Total Protein: 7 g/dL (ref 6.0–8.5)

## 2018-10-08 LAB — LIPID PANEL
Chol/HDL Ratio: 4.1 ratio (ref 0.0–4.4)
Cholesterol, Total: 207 mg/dL — ABNORMAL HIGH (ref 100–199)
HDL: 51 mg/dL (ref >39)
LDL Calculated: 109 mg/dL — ABNORMAL HIGH (ref 0–99)
Triglycerides: 236 mg/dL — ABNORMAL HIGH (ref 0–149)
VLDL Cholesterol Cal: 47 mg/dL — ABNORMAL HIGH (ref 5–40)

## 2018-10-08 LAB — CBC WITH DIFFERENTIAL/PLATELET
Basophils Absolute: 0 10*3/uL (ref 0.0–0.2)
Basos: 1 %
EOS (ABSOLUTE): 0.2 10*3/uL (ref 0.0–0.4)
Eos: 3 %
Hematocrit: 43.1 % (ref 34.0–46.6)
Hemoglobin: 14.6 g/dL (ref 11.1–15.9)
Immature Grans (Abs): 0 10*3/uL (ref 0.0–0.1)
Immature Granulocytes: 0 %
Lymphocytes Absolute: 1.9 10*3/uL (ref 0.7–3.1)
Lymphs: 24 %
MCH: 32.7 pg (ref 26.6–33.0)
MCHC: 33.9 g/dL (ref 31.5–35.7)
MCV: 96 fL (ref 79–97)
Monocytes Absolute: 0.7 10*3/uL (ref 0.1–0.9)
Monocytes: 8 %
Neutrophils Absolute: 5.2 10*3/uL (ref 1.4–7.0)
Neutrophils: 64 %
Platelets: 225 10*3/uL (ref 150–450)
RBC: 4.47 x10E6/uL (ref 3.77–5.28)
RDW: 13.6 % (ref 11.7–15.4)
WBC: 8.1 10*3/uL (ref 3.4–10.8)

## 2018-10-08 LAB — HEMOGLOBIN A1C
Est. average glucose Bld gHb Est-mCnc: 117 mg/dL
Hgb A1c MFr Bld: 5.7 % — ABNORMAL HIGH (ref 4.8–5.6)

## 2018-10-08 LAB — TSH+FREE T4
Free T4: 1.4 ng/dL (ref 0.82–1.77)
TSH: 17.03 u[IU]/mL — ABNORMAL HIGH (ref 0.450–4.500)

## 2018-10-08 LAB — VITAMIN D 25 HYDROXY (VIT D DEFICIENCY, FRACTURES): Vit D, 25-Hydroxy: 36.3 ng/mL (ref 30.0–100.0)

## 2018-10-08 NOTE — Telephone Encounter (Signed)
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  I don't know if Dr. Marius Ditch has to see her or not.  That is up to the GI office.  Patient should ask them.

## 2018-10-08 NOTE — Telephone Encounter (Signed)
Patient informed and said she already scheduled to see them next month.

## 2018-10-08 NOTE — Telephone Encounter (Signed)
Patient called saying she just received a call from Dr Verlin Grills office trying to schedule an appt to see her. However, she said she thought she discussed having a Order put in for colonoscopy and not seeing Dr. Marius Ditch. She said she is confused. Her oncologist told her to have another colonoscopy?  Please advise. Her last colonoscopy was last year and said not to re-due for 3 years?

## 2018-11-20 ENCOUNTER — Encounter: Payer: Self-pay | Admitting: Gastroenterology

## 2018-11-20 ENCOUNTER — Other Ambulatory Visit: Payer: Self-pay

## 2018-11-20 ENCOUNTER — Ambulatory Visit (INDEPENDENT_AMBULATORY_CARE_PROVIDER_SITE_OTHER): Payer: PPO | Admitting: Gastroenterology

## 2018-11-20 ENCOUNTER — Encounter (INDEPENDENT_AMBULATORY_CARE_PROVIDER_SITE_OTHER): Payer: Self-pay

## 2018-11-20 VITALS — BP 115/73 | HR 84 | Temp 98.5°F | Resp 17 | Ht 62.0 in | Wt 168.6 lb

## 2018-11-20 DIAGNOSIS — R140 Abdominal distension (gaseous): Secondary | ICD-10-CM

## 2018-11-20 DIAGNOSIS — Z9049 Acquired absence of other specified parts of digestive tract: Secondary | ICD-10-CM | POA: Diagnosis not present

## 2018-11-20 DIAGNOSIS — C7A8 Other malignant neuroendocrine tumors: Secondary | ICD-10-CM | POA: Diagnosis not present

## 2018-11-20 DIAGNOSIS — K591 Functional diarrhea: Secondary | ICD-10-CM | POA: Diagnosis not present

## 2018-11-20 MED ORDER — RIFAXIMIN 550 MG PO TABS: 550.0000 mg | ORAL_TABLET | Freq: Two times a day (BID) | ORAL | 0 refills | Status: AC

## 2018-11-20 NOTE — Progress Notes (Signed)
Cephas Darby, MD [address]  [place]  [place], [place] [address]  Main: [phone]  Fax: [phone] Pager: [phone]   Primary Care Physician: Glean Hess, MD  Primary Gastroenterologist:  Dr. Cephas Darby  Chief Complaint  Patient presents with  . Follow-up    Colonoscopy     HPI: Kristin Escobar is a 68 y.o. 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  She underwent ileocecectomy at [facility].  Surgical path revealed pT2 pN1 M0 terminal ileal carcinoid, well-differentiated, Ki-67 highlights approximately <1% of tumor cells .  Patient also underwent thyroid surgery in 08/2017, currently on Synthroid  Patient reports that since surgery, she has been experiencing severe diarrhea, postprandial urgency as well as bloating.  She tried cholestyramine initially 3 packets a day which resulted constipation.  When she reduced to 2 packets a day, her diarrhea recurred.  She is not taking any medication at this time to control diarrhea.  Even Pepto-Bismol resulted in diarrhea.  She does report fatigue.  Her most recent hemoglobin was normal.  She is also here to discuss about surveillance colonoscopy  Most recent chromogranin A levels in 06/2018 returned normal 5 HIAA levels also returned normal C. difficile negative for infection Her weight has been stable, with good appetite  Current Outpatient Medications  Medication Sig Dispense Refill  . Cholecalciferol (VITAMIN D3) 5000 units CAPS Take 1 capsule (5,000 Units total) by mouth daily. 30 capsule   . levothyroxine (SYNTHROID, LEVOTHROID) 88 MCG tablet Take 1 tablet (88 mcg total) by mouth daily before breakfast. 90 tablet 1  . Multiple Vitamins-Minerals (MULTIVITAMIN WITH MINERALS) tablet Take by mouth.    . vitamin B-12 (CYANOCOBALAMIN) 100 MCG tablet Take by mouth.    . cholestyramine (QUESTRAN) 4 g packet Take by mouth.     . rifaximin (XIFAXAN) 550 MG TABS tablet Take 1 tablet (550 mg total) by mouth 2 (two) times daily for 14 days. 28 tablet 0   No current facility-administered medications for this visit.     Allergies as of 11/20/2018  . (No Known Allergies)    NSAIDs: None  Antiplts/Anticoagulants/Anti thrombotics: None  GI procedures:  Colonoscopy 06/2017  - One ileal polypoid lesion in the terminal ileum. Resection not attempted. Biopsied. - One 9 mm polyp in the cecum, removed with mucosal resection. Resected and retrieved. Clip was placed. - One 5 mm polyp in the cecum, removed with a cold snare. Resected and retrieved. - One 7 mm polyp in the cecum, removed with a hot snare. Resected and retrieved. Clip was placed. - One 7 mm polyp in the descending colon, removed with a hot snare. Resected and retrieved. - Diverticulosis in the sigmoid colon and in the descending colon. - The distal rectum and anal verge are normal on retroflexion view. - Mucosal resection was performed. Resection and retrieval were complete.    Ileocecectomy, robotic surgery in 09/2017  Final Diagnosis  A: Terminal ileum, cecum, appendix, and proximal ascending colon, segmental resection - Well differentiated neuroendocrine tumor, see synoptic report for additional information. - Mesenteric en face resection margin positive; focal mesenteric tumor deposit present at the mesenteric en face resection margin (slide A3).  IMAGING: PET Dotatate 01/09/18 Impression  -- No evidence of somatostatin receptor positive malignancy. -- Patient is status post prior right lower quadrant colonic anastomosis and removal of the previously described focus in the distal ileum adjacent to  the ileocecal valve. -- Patient is status post interval removal of the thyroid/anterior mediastinal mass with no residual dilatation of the noted within the tumor bed. -- Mild radiotracer avidity about the sternum status post sternotomy with mild  displacement of the sternal fragments    ROS:  General: Negative for anorexia, weight loss, fever, chills, fatigue, weakness. ENT: Negative for hoarseness, difficulty swallowing , nasal congestion. CV: Negative for chest pain, angina, palpitations, dyspnea on exertion, peripheral edema.  Respiratory: Negative for dyspnea at rest, dyspnea on exertion, cough, sputum, wheezing.  GI: See history of present illness. GU:  Negative for dysuria, hematuria, urinary incontinence, urinary frequency, nocturnal urination.  Endo: Negative for unusual weight change.    Physical Examination:   BP 115/73 (BP Location: Left Arm, Patient Position: Sitting, Cuff Size: Large)   Pulse 84   Temp 98.5 F (36.9 C)   Resp 17   Ht 5' 2"  (1.575 m)   Wt 168 lb 9.6 oz (76.5 kg)   BMI 30.84 kg/m   General: Well-nourished, well-developed in no acute distress.  Eyes: No icterus. Conjunctivae pink. Mouth: Oropharyngeal mucosa moist and pink , no lesions erythema or exudate. Lungs: Clear to auscultation bilaterally. Non-labored. Heart: Regular rate and rhythm, no murmurs rubs or gallops.  Abdomen: Bowel sounds are normal, nontender, nondistended, no hepatosplenomegaly or masses, no hernia , no rebound or guarding.   Extremities: No lower extremity edema. No clubbing or deformities. Neuro: Alert and oriented x 3.  Grossly intact. Skin: Warm and dry, no jaundice.   Psych: Alert and cooperative, normal mood and affect.   Imaging Studies: Reviewed  Assessment and Plan:   Kristin Escobar is a 68 y.o. female here for follow-up of small bowel neuroendocrine tumor, well-differentiated T2N1, status post ileocecal resection, thyroid surgery on Synthroid has been experiencing chronic diarrhea and abdominal bloating.  Differentials include bile salt diarrhea or bacterial overgrowth secondary to ileocecal resection.  Cholestyramine did not help  Recommend trial of rifaximin for possible bacterial overgrowth Follow-up  with [facility]ology [facility] regularly Patient will get annual PET CT scan I will schedule surveillance colonoscopy  Follow up as needed   Dr Sherri Sear, MD

## 2018-11-25 ENCOUNTER — Other Ambulatory Visit: Payer: Self-pay

## 2018-11-25 ENCOUNTER — Ambulatory Visit
Admission: RE | Admit: 2018-11-25 | Discharge: 2018-11-25 | Disposition: A | Discharge status: Home / Self Care | Payer: PPO | Source: Ambulatory Visit | Attending: Internal Medicine | Admitting: Internal Medicine

## 2018-11-25 ENCOUNTER — Encounter (INDEPENDENT_AMBULATORY_CARE_PROVIDER_SITE_OTHER): Payer: Self-pay

## 2018-11-25 DIAGNOSIS — M85851 Other specified disorders of bone density and structure, right thigh: Secondary | ICD-10-CM | POA: Insufficient documentation

## 2018-11-25 DIAGNOSIS — Z1231 Encounter for screening mammogram for malignant neoplasm of breast: Secondary | ICD-10-CM | POA: Diagnosis not present

## 2018-11-25 DIAGNOSIS — Z13820 Encounter for screening for osteoporosis: Secondary | ICD-10-CM | POA: Insufficient documentation

## 2018-12-08 ENCOUNTER — Ambulatory Visit: Admit: 2018-12-08 | Payer: PPO | Admitting: Gastroenterology

## 2018-12-08 SURGERY — COLONOSCOPY WITH PROPOFOL
Anesthesia: General

## 2018-12-10 ENCOUNTER — Encounter: Payer: Self-pay | Admitting: Anesthesiology

## 2018-12-12 ENCOUNTER — Other Ambulatory Visit: Payer: Self-pay

## 2018-12-12 ENCOUNTER — Other Ambulatory Visit
Admission: RE | Admit: 2018-12-12 | Discharge: 2018-12-12 | Disposition: A | Discharge status: Home / Self Care | Payer: PPO | Source: Ambulatory Visit | Attending: Gastroenterology | Admitting: Gastroenterology

## 2018-12-12 DIAGNOSIS — Z1159 Encounter for screening for other viral diseases: Secondary | ICD-10-CM | POA: Insufficient documentation

## 2018-12-12 LAB — SARS CORONAVIRUS 2 (TAT 6-24 HRS): SARS Coronavirus 2: NEGATIVE

## 2018-12-15 NOTE — Anesthesia Preprocedure Evaluation (Addendum)
Anesthesia Evaluation  Patient identified by MRN, date of birth, ID band Patient awake    Reviewed: Allergy & Precautions, NPO status , Patient's Chart, lab work & pertinent test results  History of Anesthesia Complications Negative for: history of anesthetic complications  Airway Mallampati: I  TM Distance: >3 FB Neck ROM: Full    Dental  (+)    Pulmonary Current Smoker,    Pulmonary exam normal breath sounds clear to auscultation       Cardiovascular Exercise Tolerance: Good negative cardio ROS Normal cardiovascular exam Rhythm:Regular Rate:Normal     Neuro/Psych PSYCHIATRIC DISORDERS Depression Vertigo     GI/Hepatic Small bowel cancer   Endo/Other  Hypothyroidism Pre-diabetes  Renal/GU negative Renal ROS     Musculoskeletal   Abdominal   Peds  Hematology negative hematology ROS (+)   Anesthesia Other Findings   Reproductive/Obstetrics                             Anesthesia Physical  Anesthesia Plan  ASA: II  Anesthesia Plan: General   Post-op Pain Management:    Induction: Intravenous  PONV Risk Score and Plan: 2 and Propofol infusion and TIVA  Airway Management Planned: Natural Airway  Additional Equipment:   Intra-op Plan:   Post-operative Plan:   Informed Consent: I have reviewed the patients History and Physical, chart, labs and discussed the procedure including the risks, benefits and alternatives for the proposed anesthesia with the patient or authorized representative who has indicated his/her understanding and acceptance.       Plan Discussed with: CRNA  Anesthesia Plan Comments:        Anesthesia Quick Evaluation

## 2018-12-16 NOTE — Discharge Instructions (Signed)

## 2018-12-17 ENCOUNTER — Ambulatory Visit
Admission: RE | Admit: 2018-12-17 | Discharge: 2018-12-17 | Disposition: A | Discharge status: Home / Self Care | Payer: PPO | Attending: Gastroenterology | Admitting: Gastroenterology

## 2018-12-17 ENCOUNTER — Encounter
Admission: RE | Disposition: A | Discharge status: Home / Self Care | Payer: Self-pay | Source: Home / Self Care | Attending: Gastroenterology

## 2018-12-17 ENCOUNTER — Ambulatory Visit: Payer: PPO | Admitting: Anesthesiology

## 2018-12-17 ENCOUNTER — Other Ambulatory Visit: Payer: Self-pay

## 2018-12-17 DIAGNOSIS — K5730 Diverticulosis of large intestine without perforation or abscess without bleeding: Secondary | ICD-10-CM | POA: Insufficient documentation

## 2018-12-17 DIAGNOSIS — Z800 Family history of malignant neoplasm of digestive organs: Secondary | ICD-10-CM | POA: Diagnosis not present

## 2018-12-17 DIAGNOSIS — Z85038 Personal history of other malignant neoplasm of large intestine: Secondary | ICD-10-CM

## 2018-12-17 DIAGNOSIS — K635 Polyp of colon: Secondary | ICD-10-CM | POA: Diagnosis not present

## 2018-12-17 DIAGNOSIS — E039 Hypothyroidism, unspecified: Secondary | ICD-10-CM | POA: Insufficient documentation

## 2018-12-17 DIAGNOSIS — Z79890 Hormone replacement therapy (postmenopausal): Secondary | ICD-10-CM | POA: Diagnosis not present

## 2018-12-17 DIAGNOSIS — D125 Benign neoplasm of sigmoid colon: Secondary | ICD-10-CM | POA: Insufficient documentation

## 2018-12-17 DIAGNOSIS — Z980 Intestinal bypass and anastomosis status: Secondary | ICD-10-CM | POA: Insufficient documentation

## 2018-12-17 DIAGNOSIS — D123 Benign neoplasm of transverse colon: Secondary | ICD-10-CM | POA: Diagnosis not present

## 2018-12-17 DIAGNOSIS — Z08 Encounter for follow-up examination after completed treatment for malignant neoplasm: Secondary | ICD-10-CM | POA: Insufficient documentation

## 2018-12-17 DIAGNOSIS — F17210 Nicotine dependence, cigarettes, uncomplicated: Secondary | ICD-10-CM | POA: Insufficient documentation

## 2018-12-17 DIAGNOSIS — Z85060 Personal history of malignant carcinoid tumor of small intestine: Secondary | ICD-10-CM | POA: Insufficient documentation

## 2018-12-17 DIAGNOSIS — C7A8 Other malignant neuroendocrine tumors: Secondary | ICD-10-CM | POA: Diagnosis not present

## 2018-12-17 DIAGNOSIS — D122 Benign neoplasm of ascending colon: Secondary | ICD-10-CM | POA: Diagnosis not present

## 2018-12-17 HISTORY — PX: POLYPECTOMY: SHX5525

## 2018-12-17 HISTORY — PX: COLONOSCOPY WITH PROPOFOL: SHX5780

## 2018-12-17 SURGERY — COLONOSCOPY WITH PROPOFOL
Anesthesia: General

## 2018-12-17 MED ORDER — ACETAMINOPHEN 325 MG PO TABS: 650.0000 mg | ORAL_TABLET | Freq: Once | ORAL | Status: DC | PRN

## 2018-12-17 MED ORDER — LIDOCAINE HCL (CARDIAC) PF 100 MG/5ML IV SOSY
PREFILLED_SYRINGE | INTRAVENOUS | Status: DC | PRN
  Administered 2018-12-17: 40 mg via INTRAVENOUS

## 2018-12-17 MED ORDER — LACTATED RINGERS IV SOLN
INTRAVENOUS | Status: DC
  Administered 2018-12-17: 07:00:00 via INTRAVENOUS

## 2018-12-17 MED ORDER — ONDANSETRON HCL 4 MG/2ML IJ SOLN: 4.0000 mg | Freq: Once | INTRAMUSCULAR | Status: DC | PRN

## 2018-12-17 MED ORDER — SODIUM CHLORIDE 0.9 % IV SOLN: INTRAVENOUS | Status: DC

## 2018-12-17 MED ORDER — STERILE WATER FOR IRRIGATION IR SOLN
Status: DC | PRN
  Administered 2018-12-17: 15 mL

## 2018-12-17 MED ORDER — PROPOFOL 10 MG/ML IV BOLUS
INTRAVENOUS | Status: DC | PRN
  Administered 2018-12-17: 20 mg via INTRAVENOUS
  Administered 2018-12-17: 120 mg via INTRAVENOUS
  Administered 2018-12-17: 30 mg via INTRAVENOUS
  Administered 2018-12-17 (×4): 20 mg via INTRAVENOUS

## 2018-12-17 MED ORDER — ACETAMINOPHEN 160 MG/5ML PO SOLN: 325.0000 mg | ORAL | Status: DC | PRN

## 2018-12-17 SURGICAL SUPPLY — 25 items
CANISTER SUCT 1200ML W/VALVE (MISCELLANEOUS) ×4 IMPLANT
CLIP HMST 235XBRD CATH ROT (MISCELLANEOUS) IMPLANT
CLIP RESOLUTION 360 11X235 (MISCELLANEOUS)
ELECT REM PT RETURN 9FT ADLT (ELECTROSURGICAL)
ELECTRODE REM PT RTRN 9FT ADLT (ELECTROSURGICAL) IMPLANT
FCP ESCP3.2XJMB 240X2.8X (MISCELLANEOUS)
FORCEPS BIOP RAD 4 LRG CAP 4 (CUTTING FORCEPS) IMPLANT
FORCEPS BIOP RJ4 240 W/NDL (MISCELLANEOUS)
FORCEPS ESCP3.2XJMB 240X2.8X (MISCELLANEOUS) IMPLANT
GOWN CVR UNV OPN BCK APRN NK (MISCELLANEOUS) ×4 IMPLANT
GOWN ISOL THUMB LOOP REG UNIV (MISCELLANEOUS) ×4
INJECTOR VARIJECT VIN23 (MISCELLANEOUS) IMPLANT
KIT DEFENDO VALVE AND CONN (KITS) IMPLANT
KIT ENDO PROCEDURE OLY (KITS) ×4 IMPLANT
MARKER SPOT ENDO TATTOO 5ML (MISCELLANEOUS) IMPLANT
PROBE APC STR FIRE (PROBE) IMPLANT
RETRIEVER NET ROTH 2.5X230 LF (MISCELLANEOUS) IMPLANT
SNARE COLD EXACTO (MISCELLANEOUS) ×2 IMPLANT
SNARE SHORT THROW 13M SML OVAL (MISCELLANEOUS) IMPLANT
SNARE SHORT THROW 30M LRG OVAL (MISCELLANEOUS) IMPLANT
SNARE SNG USE RND 15MM (INSTRUMENTS) IMPLANT
SPOT EX ENDOSCOPIC TATTOO (MISCELLANEOUS)
TRAP ETRAP POLY (MISCELLANEOUS) ×2 IMPLANT
VARIJECT INJECTOR VIN23 (MISCELLANEOUS)
WATER STERILE IRR 250ML POUR (IV SOLUTION) ×4 IMPLANT

## 2018-12-17 NOTE — H&P (Signed)
Kristin Darby, MD [address]  [place]  [place], [place] [address]  Main: [phone]  Fax: [phone] Pager: [phone]  Primary Care Physician:  Glean Hess, MD Primary Gastroenterologist:  Dr. Cephas Escobar  Pre-Procedure History & Physical: HPI:  Kristin Escobar is a 68 y.o. female is here for an colonoscopy.   Past Medical History:  Diagnosis Date  . Former smoker   . 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 Vertigo    no episodes in over 10 yrs    Past Surgical History:  Procedure Laterality Date  . COLONOSCOPY WITH PROPOFOL N/A 07/17/2017   Procedure: COLONOSCOPY WITH PROPOFOL;  Surgeon: Lin Landsman, MD;  Location: [facility];  Service: Endoscopy;  Laterality: N/A;  . LAPAROSCOPIC ILEOCECECTOMY  09/2017   neuroendocrine tumor  . POLYPECTOMY  07/17/2017   Procedure: POLYPECTOMY;  Surgeon: Lin Landsman, MD;  Location: [facility];  Service: Endoscopy;;  . TOTAL THYROIDECTOMY  08/2017   no carcinoma    Prior to Admission medications   Medication Sig Start Date End Date Taking? Authorizing Provider  Cholecalciferol (VITAMIN D3) 5000 units CAPS Take 1 capsule (5,000 Units total) by mouth daily. 07/15/17  Yes Plonk, Gwyndolyn Saxon, MD  levothyroxine (SYNTHROID, LEVOTHROID) 88 MCG tablet Take 1 tablet (88 mcg total) by mouth daily before breakfast. 08/15/18  Yes Glean Hess, MD  Multiple Vitamins-Minerals (HAIR SKIN AND NAILS FORMULA) TABS Take by mouth.   Yes [provider]  Multiple Vitamins-Minerals (MULTIVITAMIN WITH MINERALS) tablet Take by mouth.   Yes [provider]  vitamin B-12 (CYANOCOBALAMIN) 100 MCG tablet Take by mouth.   Yes [provider]  cholestyramine Lucrezia Starch) 4 g packet Take by mouth. 01/17/18 01/17/19  [provider]    Allergies as of 11/20/2018  . (No Known Allergies)    Family History  Problem Relation Age of Onset  . Stroke Mother   .  Colon cancer Father   . Prostate cancer Father   . Breast cancer Maternal Aunt 70  . Liver cancer Maternal Aunt   . Stomach cancer Maternal Aunt     Social History   Socioeconomic History  . Marital status: Single    Spouse name: Not on file  . Number of children: Not on file  . Years of education: Not on file  . Highest education level: Not on file  Occupational History  . Occupation: Retired   Scientific laboratory technician  . Financial resource strain: Somewhat hard  . Food insecurity    Worry: Sometimes true    Inability: Never true  . Transportation needs    Medical: Patient refused    Non-medical: Patient refused  Tobacco Use  . Smoking status: Current Some Day Smoker    Packs/day: 0.25    Years: 15.00    Pack years: 3.75    Types: Cigarettes    Last attempt to quit: 08/09/2017    Years since quitting: 1.3  . Smokeless tobacco: Never Used  Substance and Sexual Activity  . Alcohol use: Yes    Comment: 1-2 drinks/month  . Drug use: No  . Sexual activity: Never  Lifestyle  . Physical activity    Days per week: 7 days    Minutes per session: 10 min  . Stress: Rather much  Relationships  . Social connections    Talks on phone: More than three times a week    Gets together: Twice a week    Attends  religious service: 1 to 4 times per year    Active member of club or organization: No    Attends meetings of clubs or organizations: Never    Relationship status: Never married  . Intimate partner violence    Fear of current or ex partner: Patient refused    Emotionally abused: Patient refused    Physically abused: Patient refused    Forced sexual activity: Patient refused  Other Topics Concern  . Not on file  Social History Narrative  . Not on file    Review of Systems: See HPI, otherwise negative ROS  Physical Exam: BP 107/65   Pulse 84   Temp (!) 97.5 F (36.4 C) (Temporal)   Ht 5' 2.5" (1.588 m)   Wt 73.5 kg   SpO2 97%   BMI 29.16 kg/m  General:   Alert,   pleasant and cooperative in NAD Head:  Normocephalic and atraumatic. Neck:  Supple; no masses or thyromegaly. Lungs:  Clear throughout to auscultation.    Heart:  Regular rate and rhythm. Abdomen:  Soft, nontender and nondistended. Normal bowel sounds, without guarding, and without rebound.   Neurologic:  Alert and  oriented x4;  grossly normal neurologically.  Impression/Plan: [name] is here for an colonoscopy to be performed for surveillance, h/o small bowel carcinoid  Risks, benefits, limitations, and alternatives regarding  colonoscopy have been reviewed with the patient.  Questions have been answered.  All parties agreeable.   Sherri Sear, MD  12/17/2018, 8:13 AM

## 2018-12-17 NOTE — Anesthesia Postprocedure Evaluation (Signed)
Anesthesia Post Note  Patient: Kristin Escobar  Procedure(s) Performed: COLONOSCOPY WITH PROPOFOL (N/A ) POLYPECTOMY  Patient location during evaluation: PACU Anesthesia Type: General Level of consciousness: awake and alert, oriented and patient cooperative Pain management: pain level controlled Vital Signs Assessment: post-procedure vital signs reviewed and stable Respiratory status: spontaneous breathing, nonlabored ventilation and respiratory function stable Cardiovascular status: blood pressure returned to baseline and stable Postop Assessment: adequate PO intake Anesthetic complications: no    Darrin Nipper

## 2018-12-17 NOTE — Transfer of Care (Signed)
Immediate Anesthesia Transfer of Care Note  Patient: Kristin Escobar  Procedure(s) Performed: COLONOSCOPY WITH PROPOFOL (N/A ) POLYPECTOMY  Patient Location: PACU  Anesthesia Type: General  Level of Consciousness: awake, alert  and patient cooperative  Airway and Oxygen Therapy: Patient Spontanous Breathing and Patient connected to supplemental oxygen  Post-op Assessment: Post-op Vital signs reviewed, Patient's Cardiovascular Status Stable, Respiratory Function Stable, Patent Airway and No signs of Nausea or vomiting  Post-op Vital Signs: Reviewed and stable  Complications: No apparent anesthesia complications

## 2018-12-17 NOTE — Op Note (Signed)
[facility] Patient Name: Kristin Escobar Procedure Date: 12/17/2018 7:59 AM MRN: [id number] Account #: [id number] Date of Birth: 19-Nov-1950 Admit Type: Outpatient Age: 68 Room: [facility] OR ROOM 01 Gender: Female Note Status: Finalized Procedure:            Colonoscopy Indications:          High risk colon cancer surveillance: Personal history                        of small bowel carcinoid in the terminal ileum Providers:            Lin Landsman MD, MD Referring MD:         Halina Maidens, MD (Referring MD) Medicines:            Monitored Anesthesia Care Complications:        No immediate complications. Estimated blood loss: None. Procedure:            Pre-Anesthesia Assessment:                       - Prior to the procedure, a History and Physical was                        performed, and patient medications and allergies were                        reviewed. The patient is competent. The risks and                        benefits of the procedure and the sedation options and                        risks were discussed with the patient. All questions                        were answered and informed consent was obtained.                        Patient identification and proposed procedure were                        verified by the physician, the nurse, the                        anesthesiologist, the anesthetist and the technician in                        the pre-procedure area in the procedure room in the                        endoscopy suite. Mental Status Examination: alert and                        oriented. Airway Examination: normal oropharyngeal                        airway and neck mobility. Respiratory Examination:                        clear to auscultation. CV Examination: normal.  Prophylactic Antibiotics: The patient does not require                        prophylactic antibiotics. Prior Anticoagulants:  The                        patient has taken no previous anticoagulant or                        antiplatelet agents. ASA Grade Assessment: II - A                        patient with mild systemic disease. After reviewing the                        risks and benefits, the patient was deemed in                        satisfactory condition to undergo the procedure. The                        anesthesia plan was to use monitored anesthesia care                        (MAC). Immediately prior to administration of                        medications, the patient was re-assessed for adequacy                        to receive sedatives. The heart rate, respiratory rate,                        oxygen saturations, blood pressure, adequacy of                        pulmonary ventilation, and response to care were                        monitored throughout the procedure. The physical status                        of the patient was re-assessed after the procedure.                       After obtaining informed consent, the colonoscope was                        passed under direct vision. Throughout the procedure,                        the patient's blood pressure, pulse, and oxygen                        saturations were monitored continuously. The                        Colonoscope was introduced through the anus and  advanced to the the ileocolonic anastomosis. The                        colonoscopy was performed without difficulty. The                        patient tolerated the procedure well. The quality of                        the bowel preparation was evaluated using the BBPS                        [facility] Bowel Preparation Scale) with scores of: Right                        Colon = 3, Transverse Colon = 3 and Left Colon = 3                        (entire mucosa seen well with no residual staining,                        small fragments of stool or opaque liquid).  The total                        BBPS score equals 9. Findings:      The perianal and digital rectal examinations were normal. Pertinent       negatives include normal sphincter tone and no palpable rectal lesions.      There was evidence of a prior side-to-side ileo-colonic anastomosis in       the cecum. This was patent and was characterized by healthy appearing       mucosa. The anastomosis was traversed.      The neo-terminal ileum appeared normal.      Four sessile polyps were found in the sigmoid colon, transverse colon,       ascending colon and cecum. The polyps were 5 mm in size. These polyps       were removed with a cold snare. Resection and retrieval were complete.      Multiple diverticula were found in the sigmoid colon.      The retroflexed view of the distal rectum and anal verge was normal and       showed no anal or rectal abnormalities. Impression:           - Patent side-to-side ileo-colonic anastomosis,                        characterized by healthy appearing mucosa.                       - The examined portion of the ileum was normal.                       - Four 5 mm polyps in the sigmoid colon, in the                        transverse colon, in the ascending colon and in the                        cecum, removed with  a cold snare. Resected and                        retrieved.                       - Diverticulosis in the sigmoid colon.                       - The distal rectum and anal verge are normal on                        retroflexion view. Recommendation:       - Discharge patient to home (with escort).                       - Resume previous diet today.                       - Continue present medications.                       - Await pathology results.                       - Repeat colonoscopy in 3 years for surveillance of                        multiple polyps. Procedure Code(s):    --- Professional ---                       [phone], Colonoscopy,  flexible; with removal of tumor(s),                        polyp(s), or other lesion(s) by snare technique Diagnosis Code(s):    --- Professional ---                       T88.828, Personal history of other malignant neoplasm                        of large intestine                       K63.5, Polyp of colon                       Z98.0, Intestinal bypass and anastomosis status                       K57.30, Diverticulosis of large intestine without                        perforation or abscess without bleeding CPT copyright 2019 American Medical Association. All rights reserved. The codes documented in this report are preliminary and upon coder review may  be revised to meet current compliance requirements. Dr. Ulyess Mort Lin Landsman MD, MD 12/17/2018 8:45:52 AM This report has been signed electronically. Number of Addenda: 0 Note Initiated On: 12/17/2018 7:59 AM Scope Withdrawal Time: 0 hours 13 minutes 6 seconds  Total Procedure Duration: 0 hours 15 minutes 11 seconds  Estimated Blood Loss: Estimated blood loss: none.      [facility]

## 2018-12-17 NOTE — Anesthesia Procedure Notes (Signed)
Date/Time: 12/17/2018 8:19 AM Performed by: Cameron Ali, CRNA Pre-anesthesia Checklist: Patient identified, Emergency Drugs available, Suction available, Timeout performed and Patient being monitored Patient Re-evaluated:Patient Re-evaluated prior to induction Oxygen Delivery Method: Nasal cannula Placement Confirmation: positive ETCO2

## 2018-12-18 ENCOUNTER — Encounter: Payer: Self-pay | Admitting: Gastroenterology

## 2019-01-22 DIAGNOSIS — Z9049 Acquired absence of other specified parts of digestive tract: Secondary | ICD-10-CM | POA: Diagnosis not present

## 2019-01-22 DIAGNOSIS — R935 Abnormal findings on diagnostic imaging of other abdominal regions, including retroperitoneum: Secondary | ICD-10-CM | POA: Diagnosis not present

## 2019-01-22 DIAGNOSIS — Z9089 Acquired absence of other organs: Secondary | ICD-10-CM | POA: Diagnosis not present

## 2019-01-22 DIAGNOSIS — Z08 Encounter for follow-up examination after completed treatment for malignant neoplasm: Secondary | ICD-10-CM | POA: Diagnosis not present

## 2019-01-22 DIAGNOSIS — Z859 Personal history of malignant neoplasm, unspecified: Secondary | ICD-10-CM | POA: Diagnosis not present

## 2019-01-23 DIAGNOSIS — C7A8 Other malignant neuroendocrine tumors: Secondary | ICD-10-CM | POA: Diagnosis not present

## 2019-01-23 DIAGNOSIS — Z6830 Body mass index (BMI) 30.0-30.9, adult: Secondary | ICD-10-CM | POA: Diagnosis not present

## 2019-01-23 DIAGNOSIS — Z08 Encounter for follow-up examination after completed treatment for malignant neoplasm: Secondary | ICD-10-CM | POA: Diagnosis not present

## 2019-01-23 DIAGNOSIS — D3A012 Benign carcinoid tumor of the ileum: Secondary | ICD-10-CM | POA: Diagnosis not present

## 2019-01-23 DIAGNOSIS — Z85060 Personal history of malignant carcinoid tumor of small intestine: Secondary | ICD-10-CM | POA: Diagnosis not present

## 2019-01-26 ENCOUNTER — Telehealth: Payer: Self-pay | Admitting: Internal Medicine

## 2019-01-26 NOTE — Telephone Encounter (Signed)
Is she calling the wrong office? We have not seen this patient since May and we have not given her a B12 injection. She needs to call the office that gave her the injection.  Thank you.

## 2019-01-26 NOTE — Telephone Encounter (Signed)
Got a b12 shot on Friday and then was told a b12 was sent in to her pharmacy, is confused as to why it was sent in if she got in done on Friday? Needs to know what to do what was sent in to the pharmacy that she picked up already.

## 2019-02-23 ENCOUNTER — Other Ambulatory Visit: Payer: Self-pay

## 2019-02-23 DIAGNOSIS — E890 Postprocedural hypothyroidism: Secondary | ICD-10-CM

## 2019-02-23 MED ORDER — LEVOTHYROXINE SODIUM 88 MCG PO TABS: 88.0000 ug | ORAL_TABLET | Freq: Every day | ORAL | 1 refills | Status: DC

## 2019-02-23 NOTE — Progress Notes (Signed)
Pt called confused saying she was given a B12 Injection 30 days ago at Dr. Dossie Arbour office in [place]. She said after the injection, she was sent home with a RX to start giving herself the injections at home.  Patient has never given herself injections before and when asked where she needs to give herself the injection she said "my butt."  Told patient to wait for injection and I am going to contact Dr Cloyd Stagers office.   Called Dr Cloyd Stagers office and left message for nurse to call me back about pt B12 issue.

## 2019-02-24 ENCOUNTER — Telehealth: Payer: Self-pay

## 2019-02-24 NOTE — Telephone Encounter (Signed)
Dr. Dossie Arbour nurse- Lelon Frohlich - called saying pt needs to get B12 injections every 30 days and would like to see if the patient can have this done here in our clinic so the patient does not have to drive to [place].  Please advise. If agreeable, I'll call th patient to set up nurse visit.

## 2019-03-03 ENCOUNTER — Other Ambulatory Visit: Payer: Self-pay

## 2019-03-03 ENCOUNTER — Encounter: Payer: Self-pay | Admitting: Internal Medicine

## 2019-03-03 ENCOUNTER — Ambulatory Visit (INDEPENDENT_AMBULATORY_CARE_PROVIDER_SITE_OTHER): Payer: PPO | Admitting: Internal Medicine

## 2019-03-03 VITALS — BP 104/64 | HR 76 | Ht 62.5 in | Wt 162.0 lb

## 2019-03-03 DIAGNOSIS — E538 Deficiency of other specified B group vitamins: Secondary | ICD-10-CM | POA: Diagnosis not present

## 2019-03-03 DIAGNOSIS — C7A8 Other malignant neuroendocrine tumors: Secondary | ICD-10-CM | POA: Diagnosis not present

## 2019-03-03 DIAGNOSIS — R7989 Other specified abnormal findings of blood chemistry: Secondary | ICD-10-CM

## 2019-03-03 DIAGNOSIS — R5383 Other fatigue: Secondary | ICD-10-CM | POA: Diagnosis not present

## 2019-03-03 NOTE — Progress Notes (Signed)
Date:  03/03/2019   Name:  Kristin Escobar   DOB:  [birth year]   MRN:  [id number]   Chief Complaint: Fatigue (Pt would like to get B12 levels checked to see if B12 injections are needed.)  HPI  Review of Systems  Constitutional: Positive for fatigue. Negative for chills and diaphoresis.  Respiratory: Negative for cough, chest tightness, shortness of breath and wheezing.   Cardiovascular: Negative for chest pain, palpitations and leg swelling.  Gastrointestinal: Negative for abdominal pain, constipation and diarrhea.       Intermittent reflux  Neurological: Negative for dizziness, light-headedness and headaches.    Patient Active Problem List   Diagnosis Date Noted  . Primary malignant neuroendocrine tumor of small intestine ([facility])   . Postoperative hypothyroidism 01/24/2018  . Goiter 08/08/2017  . History of neuroendocrine cancer 07/24/2017  . Colon cancer screening   . Tobacco use disorder 07/12/2017  . Vitamin D deficiency 06/14/2017  . Gait instability 06/14/2017  . Hyperlipidemia 06/14/2017  . Depression 06/14/2017  . Insomnia 06/14/2017  . Obesity (BMI 30.0-34.9) 06/14/2017  . Prediabetes 06/14/2017  . 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 Nocturnal leg cramps 06/14/2017    No Known Allergies  Past Surgical History:  Procedure Laterality Date  . COLONOSCOPY WITH PROPOFOL N/A 07/17/2017   Procedure: COLONOSCOPY WITH PROPOFOL;  Surgeon: Lin Landsman, MD;  Location: [place];  Service: Endoscopy;  Laterality: N/A;  . COLONOSCOPY WITH PROPOFOL N/A 12/17/2018   Procedure: COLONOSCOPY WITH PROPOFOL;  Surgeon: Lin Landsman, MD;  Location: [facility];  Service: Endoscopy;  Laterality: N/A;  . LAPAROSCOPIC ILEOCECECTOMY  09/2017   neuroendocrine tumor  . POLYPECTOMY  07/17/2017   Procedure: POLYPECTOMY;  Surgeon: Lin Landsman, MD;  Location: [place];  Service: Endoscopy;;  . POLYPECTOMY  12/17/2018   Procedure: POLYPECTOMY;  Surgeon: Lin Landsman, MD;  Location: [place];  Service: Endoscopy;;  . TOTAL THYROIDECTOMY  08/2017   no carcinoma    Social History   Tobacco Use  . Smoking status: Current Some Day Smoker    Packs/day: 0.25    Years: 15.00    Pack years: 3.75    Types: Cigarettes    Last attempt to quit: 08/09/2017    Years since quitting: 1.5  . Smokeless tobacco: Never Used  Substance Use Topics  . Alcohol use: Yes    Comment: 1-2 drinks/month  . Drug use: No     Medication list has been reviewed and updated.  Current Meds  Medication Sig  . Cholecalciferol (VITAMIN D3) 5000 units CAPS Take 1 capsule (5,000 Units total) by mouth daily.  . cholestyramine (QUESTRAN) 4 g packet Take by mouth.  . levothyroxine (SYNTHROID) 88 MCG tablet Take 1 tablet (88 mcg total) by mouth daily before breakfast.  . Multiple Vitamins-Minerals (HAIR SKIN AND NAILS FORMULA) TABS Take by mouth.  . Multiple Vitamins-Minerals (MULTIVITAMIN WITH MINERALS) tablet Take by mouth.  . vitamin B-12 (CYANOCOBALAMIN) 100 MCG tablet Take by mouth.    PHQ 2/9 Scores 03/03/2019 10/07/2018 01/24/2018 06/14/2017  PHQ - 2 Score 0 0 0 2  PHQ- 9 Score - - 3 8    BP Readings from Last 3 Encounters:  03/03/19 104/64  12/17/18 107/65  11/20/18 115/73    Physical Exam Vitals signs and nursing note reviewed.  Constitutional:      General: She is not in acute distress.    Appearance: Normal appearance. She is well-developed.  HENT:     Head: Normocephalic and atraumatic.  Neck:     Musculoskeletal: Normal range of motion and neck supple.  Cardiovascular:     Rate and Rhythm: Normal rate and regular rhythm.     Pulses: Normal pulses.  Pulmonary:     Effort: Pulmonary effort is normal. No respiratory distress.  Musculoskeletal: Normal range of motion.     Right lower leg: No edema.     Left lower leg: No edema.  Skin:    General: Skin is warm and dry.     Findings: No rash.   Neurological:     Mental Status: She is alert and oriented to person, place, and time.  Psychiatric:        Behavior: Behavior normal.        Thought Content: Thought content normal.     Wt Readings from Last 3 Encounters:  03/03/19 162 lb (73.5 kg)  12/17/18 162 lb (73.5 kg)  11/20/18 168 lb 9.6 oz (76.5 kg)    BP 104/64   Pulse 76   Ht 5' 2.5" (1.588 m)   Wt 162 lb (73.5 kg)   SpO2 96%   BMI 29.16 kg/m   Assessment and Plan: 1. Other fatigue Seen by Oncology and felt to have B12 deficiency She received one dose 6 weeks ago but did not notice much benefit  2. B12 nutritional deficiency According to oncology she is B12 deficient but I can not find labs to document this Pt will have labs drawn then return to the office for instruction on self-adminstration of B12 - Vitamin B12  3. 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) Stable and doing well otherwise  4. Elevated TSH Will repeat labs in case she is moving toward hypothyroidism and this in contributing to her fatigue - TSH + free T4   Partially dictated using Editor, commissioning. Any errors are unintentional.  Halina Maidens, MD [facility]  03/03/2019

## 2019-03-04 LAB — VITAMIN B12: Vitamin B-12: 625 pg/mL (ref 232–1245)

## 2019-03-04 LAB — TSH+FREE T4
Free T4: 1.33 ng/dL (ref 0.82–1.77)
TSH: 6.86 u[IU]/mL — ABNORMAL HIGH (ref 0.450–4.500)

## 2019-04-29 IMAGING — CT NM PET NOPR SKULL BASE TO THIGH
1 of 11 series · 2 of 25 positions shown · non-contrast
Comparison: None

CLINICAL DATA: Well differentiated neuroendocrine tumor the small
intestine identified on colonoscopy.

EXAM:
NUCLEAR MEDICINE PET SKULL BASE TO THIGH
TECHNIQUE: 3.97 mCi Ga 68 DOTATATE was injected intravenously. Full-ring PET
imaging was performed from the skull base to thigh after the
radiotracer. CT data was obtained and used for attenuation
correction and anatomic localization.
Reference values:
Spleen SUV max equal
Liver SUV max equal

[Series 3: ct wb 5.0 b30f · axial · 5.0mm · 0.98mm/px · z∈[-1450,-583]mm · 2 of 290 slices shown]
[im 1/290  brain]
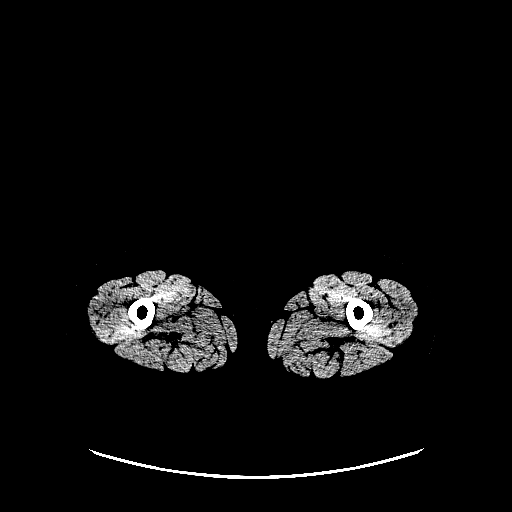
[im 290/290  brain]
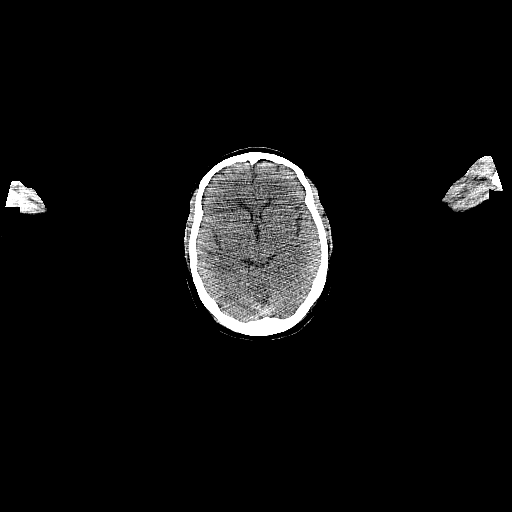

[2 of 25 positions shown; findings below may reference images not displayed]

FINDINGS: NECK

There is high radiotracer activity within the thyroid gland. The
thyroid gland is enlarged and extends into the substernal location
to the level of the inferior sternum. The activity is intense with
SUV max equal 9.7. The mass measures 6.9 x 4.7 by 9.4 cm (volume =
160 cm^3).

CHEST

No radiotracer accumulation within mediastinal or hilar lymph nodes.
No suspicious pulmonary nodules on the CT scan.

ABDOMEN/PELVIS

There is intense radiotracer activity localizing to the terminal
ileum with SUV max equal 15.3. This lesion is approximately 2 cm
from the ileocecal valve. Small 1.5 cm density is present in the
small bowel at this level (image 172, series 3). No mesenteric
nodularity or mass.

No focal abnormal accumulation of radiotracer within the liver.

Physiologic activity noted adrenal glands, spleen and kidneys.

Reference values: The SUV max of the spleen is equal to 21.8 and SUV
max of the liver is equal to 7.8.

SKELETON

No focal activity to suggest skeletal metastasis.
IMPRESSION: 1. Intense somatostatin receptor activity in the distal ileum
consistent with well differentiated neuroendocrine tumor. Small
density evident on CT.
2. No evidence of metastatic nodules within the peritoneal space. No
evidence of liver metastasis. No skeletal metastasis or distant
metastatic disease.
3. Enlargement of the thyroid gland with massive substernal
extension. This large substernal thyroid mass has moderate to high
radiotracer avidity (greater than liver) which suggests medullary
thyroid carcinoma (which express somatostatin receptors). Recommend
correlation with serum calcitonin levels and recommend thyroid
biopsy to differentiate between medullary thyroid carcinoma and
benign thyroid goiter.

These results will be called to the ordering clinician or
representative by the Radiologist Assistant, and communication
documented in the PACS or zVision Dashboard.

## 2019-08-13 ENCOUNTER — Other Ambulatory Visit: Payer: Self-pay

## 2019-08-13 DIAGNOSIS — E890 Postprocedural hypothyroidism: Secondary | ICD-10-CM

## 2019-08-13 MED ORDER — LEVOTHYROXINE SODIUM 88 MCG PO TABS: 88.0000 ug | ORAL_TABLET | Freq: Every day | ORAL | 0 refills | Status: DC

## 2019-09-06 ENCOUNTER — Other Ambulatory Visit: Payer: Self-pay | Admitting: Internal Medicine

## 2019-09-06 DIAGNOSIS — E890 Postprocedural hypothyroidism: Secondary | ICD-10-CM

## 2019-09-07 ENCOUNTER — Other Ambulatory Visit: Payer: Self-pay

## 2019-09-07 DIAGNOSIS — E890 Postprocedural hypothyroidism: Secondary | ICD-10-CM

## 2019-09-09 DIAGNOSIS — H2513 Age-related nuclear cataract, bilateral: Secondary | ICD-10-CM | POA: Diagnosis not present

## 2019-09-09 DIAGNOSIS — H5203 Hypermetropia, bilateral: Secondary | ICD-10-CM | POA: Diagnosis not present

## 2019-09-09 DIAGNOSIS — H25013 Cortical age-related cataract, bilateral: Secondary | ICD-10-CM | POA: Diagnosis not present

## 2019-09-09 DIAGNOSIS — H43812 Vitreous degeneration, left eye: Secondary | ICD-10-CM | POA: Diagnosis not present

## 2019-10-08 ENCOUNTER — Ambulatory Visit (INDEPENDENT_AMBULATORY_CARE_PROVIDER_SITE_OTHER): Payer: PPO | Admitting: Internal Medicine

## 2019-10-08 ENCOUNTER — Encounter: Payer: Self-pay | Admitting: Internal Medicine

## 2019-10-08 ENCOUNTER — Other Ambulatory Visit: Payer: Self-pay

## 2019-10-08 VITALS — BP 108/62 | HR 83 | Temp 97.4°F | Ht 62.5 in | Wt 164.0 lb

## 2019-10-08 DIAGNOSIS — E782 Mixed hyperlipidemia: Secondary | ICD-10-CM | POA: Insufficient documentation

## 2019-10-08 DIAGNOSIS — Z0000 Encounter for general adult medical examination without abnormal findings: Secondary | ICD-10-CM | POA: Diagnosis not present

## 2019-10-08 DIAGNOSIS — E559 Vitamin D deficiency, unspecified: Secondary | ICD-10-CM | POA: Diagnosis not present

## 2019-10-08 DIAGNOSIS — Z1159 Encounter for screening for other viral diseases: Secondary | ICD-10-CM | POA: Diagnosis not present

## 2019-10-08 DIAGNOSIS — Z1231 Encounter for screening mammogram for malignant neoplasm of breast: Secondary | ICD-10-CM | POA: Diagnosis not present

## 2019-10-08 DIAGNOSIS — R7303 Prediabetes: Secondary | ICD-10-CM

## 2019-10-08 DIAGNOSIS — E890 Postprocedural hypothyroidism: Secondary | ICD-10-CM | POA: Diagnosis not present

## 2019-10-08 LAB — POCT URINALYSIS DIPSTICK
Bilirubin, UA: NEGATIVE
Glucose, UA: NEGATIVE
Ketones, UA: NEGATIVE
Leukocytes, UA: NEGATIVE
Nitrite, UA: NEGATIVE
Protein, UA: POSITIVE — AB
Spec Grav, UA: 1.02 (ref 1.010–1.025)
Urobilinogen, UA: 0.2 E.U./dL
pH, UA: 5 (ref 5.0–8.0)

## 2019-10-08 NOTE — Progress Notes (Signed)
Date:  10/08/2019   Name:  Kristin Escobar   DOB:  May 23, 1951   MRN:  [id number]   Chief Complaint: Annual Exam (Breast Exam. ) Kristin Escobar is a 69 y.o. female who presents today for her Complete Annual Exam. She feels well. She reports exercising - none but keeping busy. She reports she is sleeping well. She denies breast issues.  Mammogram  10/2018 DEXA  10/2018 osteopenia hip/normal spine Colonoscopy  11/2018 Immunization History  Administered Date(s) Administered  . Influenza, High Dose Seasonal PF 04/02/2018, 02/10/2019  . PFIZER SARS-COV-2 Vaccination 07/22/2019, 08/12/2019  . Pneumococcal Conjugate-13 06/14/2017, 04/02/2018  . Pneumococcal Polysaccharide-23 10/07/2018  . Zoster Recombinat (Shingrix) 03/13/2018, 02/10/2019    Thyroid Problem Presents for follow-up visit. Patient reports no anxiety, constipation, diarrhea, fatigue, palpitations or tremors. The symptoms have been stable.  Diabetes She presents for her follow-up diabetic visit. Diabetes type: prediabetes. Her disease course has been stable. Pertinent negatives for hypoglycemia include no dizziness, headaches, nervousness/anxiousness or tremors. Pertinent negatives for diabetes include no chest pain, no fatigue, no polydipsia and no polyuria. Current diabetic treatment includes diet. Her weight is stable.    Lab Results  Component Value Date   CREATININE 0.86 10/07/2018   BUN 14 10/07/2018   NA 140 10/07/2018   K 4.1 10/07/2018   CL 102 10/07/2018   CO2 25 10/07/2018   Lab Results  Component Value Date   CHOL 207 (H) 10/07/2018   HDL 51 10/07/2018   LDLCALC 109 (H) 10/07/2018   TRIG 236 (H) 10/07/2018   CHOLHDL 4.1 10/07/2018   Lab Results  Component Value Date   TSH 6.860 (H) 03/03/2019   Lab Results  Component Value Date   HGBA1C 5.7 (H) 10/07/2018   Lab Results  Component Value Date   WBC 8.1 10/07/2018   HGB 14.6 10/07/2018   HCT 43.1 10/07/2018   MCV 96 10/07/2018   PLT 225  10/07/2018   Lab Results  Component Value Date   ALT 26 10/07/2018   AST 27 10/07/2018   ALKPHOS 84 10/07/2018   BILITOT 0.3 10/07/2018     Review of Systems  Constitutional: Negative for chills, fatigue and fever.  HENT: Negative for congestion, hearing loss, tinnitus, trouble swallowing and voice change.   Eyes: Negative for visual disturbance.  Respiratory: Negative for cough, chest tightness, shortness of breath and wheezing.   Cardiovascular: Negative for chest pain, palpitations and leg swelling.  Gastrointestinal: Positive for abdominal distention (intermittent). Negative for abdominal pain, constipation, diarrhea and vomiting.  Endocrine: Negative for polydipsia and polyuria.  Genitourinary: Negative for dysuria, frequency, genital sores, vaginal bleeding and vaginal discharge.  Musculoskeletal: Negative for arthralgias, gait problem and joint swelling.  Skin: Negative for color change and rash.  Neurological: Negative for dizziness, tremors, light-headedness and headaches.  Hematological: Negative for adenopathy. Does not bruise/bleed easily.  Psychiatric/Behavioral: Negative for dysphoric mood and sleep disturbance. The patient is not nervous/anxious.     Patient Active Problem List   Diagnosis Date Noted  . Mixed hyperlipidemia 10/08/2019  . Postoperative hypothyroidism 01/24/2018  . Goiter 08/08/2017  . History of neuroendocrine cancer 07/24/2017  . Colon cancer screening   . Tobacco use disorder 07/12/2017  . Vitamin D deficiency 06/14/2017  . Gait instability 06/14/2017  . Depression 06/14/2017  . Insomnia 06/14/2017  . Obesity (BMI 30.0-34.9) 06/14/2017  . Prediabetes 06/14/2017  . 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 Nocturnal leg cramps 06/14/2017    No Known  Allergies  Past Surgical History:  Procedure Laterality Date  . COLONOSCOPY WITH PROPOFOL N/A 07/17/2017   Procedure: COLONOSCOPY WITH PROPOFOL;  Surgeon: Lin Landsman, MD;   Location: [place];  Service: Endoscopy;  Laterality: N/A;  . COLONOSCOPY WITH PROPOFOL N/A 12/17/2018   Procedure: COLONOSCOPY WITH PROPOFOL;  Surgeon: Lin Landsman, MD;  Location: [place];  Service: Endoscopy;  Laterality: N/A;  . LAPAROSCOPIC ILEOCECECTOMY  09/2017   neuroendocrine tumor  . POLYPECTOMY  07/17/2017   Procedure: POLYPECTOMY;  Surgeon: Lin Landsman, MD;  Location: [place];  Service: Endoscopy;;  . POLYPECTOMY  12/17/2018   Procedure: POLYPECTOMY;  Surgeon: Lin Landsman, MD;  Location: [place];  Service: Endoscopy;;  . TOTAL THYROIDECTOMY  08/2017   no carcinoma    Social History   Tobacco Use  . Smoking status: Current Some Day Smoker    Packs/day: 0.25    Years: 15.00    Pack years: 3.75    Types: Cigarettes    Last attempt to quit: 08/09/2017    Years since quitting: 2.1  . Smokeless tobacco: Never Used  Substance Use Topics  . Alcohol use: Yes    Comment: 1-2 drinks/month  . Drug use: No     Medication list has been reviewed and updated.  Current Meds  Medication Sig  . cholestyramine (QUESTRAN) 4 g packet Take by mouth.  . SYNTHROID 88 MCG tablet TAKE 1 TABLET BY MOUTH ONCE DAILY BEFORE BREAKFAST    PHQ 2/9 Scores 10/08/2019 03/03/2019 10/07/2018 01/24/2018  PHQ - 2 Score 0 0 0 0  PHQ- 9 Score 0 - - 3    BP Readings from Last 3 Encounters:  10/08/19 108/62  03/03/19 104/64  12/17/18 107/65    Physical Exam Vitals and nursing note reviewed.  Constitutional:      General: She is not in acute distress.    Appearance: She is well-developed.  HENT:     Head: Normocephalic and atraumatic.     Right Ear: Tympanic membrane and ear canal normal.     Left Ear: Tympanic membrane and ear canal normal.     Nose:     Right Sinus: No maxillary sinus tenderness.     Left Sinus: No maxillary sinus tenderness.  Eyes:     General: No scleral icterus.       Right eye: No discharge.         Left eye: No discharge.     Conjunctiva/sclera: Conjunctivae normal.  Neck:     Thyroid: No thyromegaly.     Vascular: No carotid bruit.  Cardiovascular:     Rate and Rhythm: Normal rate and regular rhythm.     Pulses: Normal pulses.     Heart sounds: Normal heart sounds.  Pulmonary:     Effort: Pulmonary effort is normal. No respiratory distress.     Breath sounds: No wheezing.  Chest:     Breasts:        Right: No mass, nipple discharge, skin change or tenderness.        Left: No mass, nipple discharge, skin change or tenderness.  Abdominal:     General: Bowel sounds are normal.     Palpations: Abdomen is soft.     Tenderness: There is no abdominal tenderness.    Musculoskeletal:        General: Normal range of motion.     Right shoulder: Normal range of motion.     Left shoulder:  Tenderness (over biceps tendon) present. Normal range of motion.     Cervical back: Normal range of motion. No erythema.     Right lower leg: No edema.     Left lower leg: No edema.  Lymphadenopathy:     Cervical: No cervical adenopathy.  Skin:    General: Skin is warm and dry.     Capillary Refill: Capillary refill takes less than 2 seconds.     Findings: No rash.  Neurological:     General: No focal deficit present.     Mental Status: She is alert and oriented to person, place, and time.     Cranial Nerves: No cranial nerve deficit.     Sensory: No sensory deficit.     Deep Tendon Reflexes: Reflexes are normal and symmetric.  Psychiatric:        Mood and Affect: Mood normal.        Speech: Speech normal.        Behavior: Behavior normal.        Thought Content: Thought content normal.     Wt Readings from Last 3 Encounters:  10/08/19 164 lb (74.4 kg)  03/03/19 162 lb (73.5 kg)  12/17/18 162 lb (73.5 kg)    BP 108/62   Pulse 83   Temp (!) 97.4 F (36.3 C) (Temporal)   Ht 5' 2.5" (1.588 m)   Wt 164 lb (74.4 kg)   SpO2 95%   BMI 29.52 kg/m   Assessment and Plan: 1. Annual  physical exam Normal exam Recommend more regular exercise Continue healthy diet - POCT urinalysis dipstick  2. Encounter for screening mammogram for breast cancer Schedule at [place]; Future  3. Postoperative hypothyroidism supplemented - TSH + free T4  4. Mixed hyperlipidemia Check labs and advise - Comprehensive metabolic panel - Lipid panel  5. Prediabetes Continue healthy diet and weight - CBC with Differential/Platelet - Hemoglobin A1c  6. Need for hepatitis C screening test - Hepatitis C antibody  7. Vitamin D deficiency Pt has stopped her supplements but will likely need these indefinitely DEXA showed osteopenia - encourage weight bearing exercise and daily vitamin D and Calcium - VITAMIN D 25 Hydroxy (Vit-D Deficiency, Fractures)   Partially dictated using Editor, commissioning. Any errors are unintentional.  Halina Maidens, MD [facility]  10/08/2019

## 2019-10-08 NOTE — Patient Instructions (Addendum)
Resume daily Vitamin D supplements; also need calcium 1200 mg per day  Exercise Information for Aging Adults Staying physically active is important as you age. The four types of exercises that are best for older adults are endurance, strength, balance, and flexibility. Contact your health care provider before you start any exercise routine. Ask your health care provider what activities are safe for you. What are the risks? Risks associated with exercising include:  Overdoing it. This may lead to sore muscles or fatigue.  Falls.  Injuries.  Dehydration. How to do these exercises Endurance exercises Endurance (aerobic) exercises raise your breathing rate and heart rate. Increasing your endurance helps you to do everyday tasks and stay healthy. By improving the health of your body system that includes your heart, lungs, and blood vessels (circulatory system), you may also delay or prevent diseases such as heart disease, diabetes, and bone loss (osteoporosis). Types of endurance exercises include:  Sports.  Indoor activities, such as using gym equipment, doing water aerobics, or dancing.  Outdoor activities, such as biking or jogging.  Tasks around the house, such as gardening, yard work, and heavy household chores like cleaning.  Walking, such as hiking or walking around your neighborhood. When doing endurance exercises, make sure you:  Are aware of your surroundings.  Use safety equipment as directed.  Dress in layers when exercising outdoors.  Drink plenty of water to stay well hydrated. Build up endurance slowly. Start with 10 minutes at a time, and gradually build up to doing 30 minutes at a time. Unless your health care provider gave you different instructions, aim to exercise for a total of 150 minutes a week. Spread out that time so you are working on endurance on 3 or more days a week. Strength exercises Lifting, pulling, or pushing weights helps to strengthen muscles.  Having stronger muscles makes it easier to do everyday activities, such as getting up from a chair, climbing stairs, carrying groceries, and playing with grandchildren. Strength exercises include arm and leg exercises that may be done:  With weights.  Without weights (using your own body weight).  With a resistance band. When doing strength exercises:  Move smoothly and steadily. Do not suddenly thrust or jerk the weights, the resistance band, or your body.  Start with no weights or with light weights, and gradually add more weight over time. Eventually, aim to use weights that are hard or very hard for you to lift. This means that you are able to do 8 repetitions with the weight, and the last few repetitions are very challenging.  Lift or push weights into position for 3 seconds, hold the position for 1 second, and then take 3 seconds to return to your starting position.  Breathe out (exhale) during difficult movements, like lifting or pushing weights. Breathe in (inhale) to relax your muscles before the next repetition.  Consider alternating arms or legs, especially when you first start strength exercises.  Expect some slight muscle soreness after each session. Do strength exercises on 2 or more days a week, for 30 minutes at a time. Avoid exercising the same muscle groups two days in a row. For example, if you work on your leg muscles one day, work on your arm muscles the next day. When you can do two sets of 10-15 repetitions with a certain weight, increase the amount of weight. Balance Balance exercises can help to prevent falls. Balance exercises include:  Standing on one foot.  Heel-to-toe walk.  Balance walk.  Tai chi. Make sure you have something sturdy to hold onto while doing balance exercises, such as a sturdy chair. As your balance improves, challenge yourself by holding onto the chair with one hand instead of two, and then with no hands. Trying exercises with your eyes  closed also challenges your balance, but be sure to have a sturdy surface (like a countertop) close by in case you need it. Do balance exercises as often as you want, or as often as directed by your health care provider. Strength exercises for the lower body also help to improve balance. Flexibility  Flexibility exercises improve how far you can bend, straighten, move, or rotate parts of your body (range of motion). These exercises also help you to do everyday activities such as getting dressed or reaching for objects. Flexibility exercises include stretching different parts of the body, and they may be done in a standing or seated position or on the floor. When stretching, make sure you:  Keep a slight bend in your arms and legs. Avoid completely straightening ("locking") your joints.  Do not stretch so far that you feel pain. You should feel a mild stretching feeling. You may try stretching farther as you become more flexible over time.  Relax and breathe between stretches.  Hold onto something sturdy for balance as needed. Hold each stretch for 10-30 seconds. Repeat each stretch 3-5 times. General safety tips  Exercise in well-lit areas.  Do not hold your breath during exercises or stretches.  Warm up before exercising, and cool down after exercising. This can help prevent injury.  Drink plenty of water during exercise or any activity that makes you sweat.  Use smooth, steady movements. Do not use sudden, jerking movements, especially when lifting weights or doing flexibility exercises.  If you are not sure if an exercise is safe for you, or you are not sure how to do an exercise, talk with your health care provider. This is especially important if you have had surgery on muscles, bones, or joints (orthopedic surgery). Where to find more information You can find more information about exercise for older adults from:  Your local health department, fitness center, or community center.  These facilities may have programs for aging adults.  [employer] on Aging: http://kim-miller.com/  National Council on Aging: www.ncoa.org Summary  Staying physically active is important as you age.  Make sure to contact your health care provider before you start any exercise routine. Ask your health care provider what activities are safe for you.  Doing endurance, strength, balance, and flexibility exercises can help to delay or prevent certain diseases, such as heart disease, diabetes, and bone loss (osteoporosis). This information is not intended to replace advice given to you by your health care provider. Make sure you discuss any questions you have with your health care provider. Document Revised: 03/06/2018 Document Reviewed: 10/03/2016 Elsevier Patient Education  [place].

## 2019-10-09 LAB — COMPREHENSIVE METABOLIC PANEL
ALT: 25 IU/L (ref 0–32)
AST: 22 IU/L (ref 0–40)
Albumin/Globulin Ratio: 1.5 (ref 1.2–2.2)
Albumin: 4.5 g/dL (ref 3.8–4.8)
Alkaline Phosphatase: 101 IU/L (ref 39–117)
BUN/Creatinine Ratio: 17 (ref 12–28)
BUN: 17 mg/dL (ref 8–27)
Bilirubin Total: 0.4 mg/dL (ref 0.0–1.2)
CO2: 25 mmol/L (ref 20–29)
Calcium: 9.3 mg/dL (ref 8.7–10.3)
Chloride: 101 mmol/L (ref 96–106)
Creatinine, Ser: 1.02 mg/dL — ABNORMAL HIGH (ref 0.57–1.00)
GFR calc Af Amer: 65 mL/min/{1.73_m2} (ref >59)
GFR calc non Af Amer: 57 mL/min/{1.73_m2} — ABNORMAL LOW (ref >59)
Globulin, Total: 3 g/dL (ref 1.5–4.5)
Glucose: 93 mg/dL (ref 65–99)
Potassium: 4.4 mmol/L (ref 3.5–5.2)
Sodium: 140 mmol/L (ref 134–144)
Total Protein: 7.5 g/dL (ref 6.0–8.5)

## 2019-10-09 LAB — CBC WITH DIFFERENTIAL/PLATELET
Basophils Absolute: 0 10*3/uL (ref 0.0–0.2)
Basos: 1 %
EOS (ABSOLUTE): 0.2 10*3/uL (ref 0.0–0.4)
Eos: 4 %
Hematocrit: 42.7 % (ref 34.0–46.6)
Hemoglobin: 14.7 g/dL (ref 11.1–15.9)
Immature Grans (Abs): 0 10*3/uL (ref 0.0–0.1)
Immature Granulocytes: 0 %
Lymphocytes Absolute: 1.9 10*3/uL (ref 0.7–3.1)
Lymphs: 33 %
MCH: 33 pg (ref 26.6–33.0)
MCHC: 34.4 g/dL (ref 31.5–35.7)
MCV: 96 fL (ref 79–97)
Monocytes Absolute: 0.5 10*3/uL (ref 0.1–0.9)
Monocytes: 8 %
Neutrophils Absolute: 3.2 10*3/uL (ref 1.4–7.0)
Neutrophils: 54 %
Platelets: 247 10*3/uL (ref 150–450)
RBC: 4.45 x10E6/uL (ref 3.77–5.28)
RDW: 12.9 % (ref 11.7–15.4)
WBC: 5.9 10*3/uL (ref 3.4–10.8)

## 2019-10-09 LAB — LIPID PANEL
Chol/HDL Ratio: 4.1 ratio (ref 0.0–4.4)
Cholesterol, Total: 207 mg/dL — ABNORMAL HIGH (ref 100–199)
HDL: 51 mg/dL (ref >39)
LDL Chol Calc (NIH): 106 mg/dL — ABNORMAL HIGH (ref 0–99)
Triglycerides: 295 mg/dL — ABNORMAL HIGH (ref 0–149)
VLDL Cholesterol Cal: 50 mg/dL — ABNORMAL HIGH (ref 5–40)

## 2019-10-09 LAB — HEMOGLOBIN A1C
Est. average glucose Bld gHb Est-mCnc: 114 mg/dL
Hgb A1c MFr Bld: 5.6 % (ref 4.8–5.6)

## 2019-10-09 LAB — HEPATITIS C ANTIBODY: Hep C Virus Ab: <0.1 s/co ratio (ref 0.0–0.9)

## 2019-10-09 LAB — TSH+FREE T4
Free T4: 1.5 ng/dL (ref 0.82–1.77)
TSH: 5.04 u[IU]/mL — ABNORMAL HIGH (ref 0.450–4.500)

## 2019-10-09 LAB — VITAMIN D 25 HYDROXY (VIT D DEFICIENCY, FRACTURES): Vit D, 25-Hydroxy: 22.5 ng/mL — ABNORMAL LOW (ref 30.0–100.0)

## 2019-11-26 ENCOUNTER — Ambulatory Visit
Admission: RE | Admit: 2019-11-26 | Discharge: 2019-11-26 | Disposition: A | Discharge status: Home / Self Care | Payer: PPO | Source: Ambulatory Visit | Attending: Internal Medicine | Admitting: Internal Medicine

## 2019-11-26 ENCOUNTER — Encounter (INDEPENDENT_AMBULATORY_CARE_PROVIDER_SITE_OTHER): Payer: Self-pay

## 2019-11-26 ENCOUNTER — Other Ambulatory Visit: Payer: Self-pay

## 2019-11-26 DIAGNOSIS — Z1231 Encounter for screening mammogram for malignant neoplasm of breast: Secondary | ICD-10-CM | POA: Insufficient documentation

## 2019-12-11 ENCOUNTER — Other Ambulatory Visit: Payer: Self-pay | Admitting: Internal Medicine

## 2019-12-11 DIAGNOSIS — E890 Postprocedural hypothyroidism: Secondary | ICD-10-CM

## 2019-12-11 MED ORDER — SYNTHROID 88 MCG PO TABS: ORAL_TABLET | ORAL | 0 refills | Status: DC

## 2019-12-11 NOTE — Telephone Encounter (Signed)
Medication Refill - Medication: SYNTHROID 88 MCG tablet   Preferred Pharmacy (with phone number or street name):  [facility] [place] [facility] [address] Phone:  [phone]  Fax:  [phone]       Agent: Please be advised that RX refills may take up to 3 business days. We ask that you follow-up with your pharmacy.

## 2020-03-09 ENCOUNTER — Telehealth: Payer: Self-pay | Admitting: Internal Medicine

## 2020-03-09 NOTE — Telephone Encounter (Signed)
Left message for patient to call back and schedule Medicare Annual Wellness Visit (AWV) either virtually/audio only or in office. Whichever the patients preference is.  No history of AWV; please schedule at anytime with [facility] Advisor.  This should be a 40 minute visit  AWV-I PER [facility] AS OF 08/26/17

## 2020-03-14 ENCOUNTER — Other Ambulatory Visit: Payer: Self-pay | Admitting: Internal Medicine

## 2020-03-14 ENCOUNTER — Telehealth: Payer: Self-pay

## 2020-03-14 DIAGNOSIS — E890 Postprocedural hypothyroidism: Secondary | ICD-10-CM

## 2020-03-14 NOTE — Telephone Encounter (Signed)
[facility] [place] [address]  [place], [place] ([place])  [address]  Phone:  [phone] Fax:  [phone]   For SYNTHROID 88 MCG tablet [[id number]]

## 2020-03-15 NOTE — Telephone Encounter (Signed)
RF was sent 03/14/2020.  KP

## 2020-03-16 ENCOUNTER — Ambulatory Visit (INDEPENDENT_AMBULATORY_CARE_PROVIDER_SITE_OTHER): Payer: PPO

## 2020-03-16 DIAGNOSIS — Z0000 Encounter for general adult medical examination without abnormal findings: Secondary | ICD-10-CM | POA: Diagnosis not present

## 2020-03-16 NOTE — Progress Notes (Signed)
Subjective:   Kristin Escobar is a 69 y.o. female who presents for an Initial Medicare Annual Wellness Visit.  Virtual Visit via Telephone Note  I connected with  ANISHA STARLIPER on 03/16/20 at  2:00 PM EDT by telephone and verified that I am speaking with the correct person using two identifiers.  Medicare Annual Wellness visit completed telephonically due to Covid-19 pandemic.   Location: Patient: home Provider: [facility]   I discussed the limitations, risks, security and privacy concerns of performing an evaluation and management service by telephone and the availability of in person appointments. The patient expressed understanding and agreed to proceed.  Unable to perform video visit due to video visit attempted and failed and/or patient does not have video capability.   Some vital signs may be absent or patient reported.   Clemetine Marker, LPN    Review of Systems     Cardiac Risk Factors include: advanced age (>75men, >59 women)     Objective:    There were no vitals filed for this visit. There is no height or weight on file to calculate BMI.  Advanced Directives 03/16/2020 12/17/2018 08/07/2017 07/24/2017 07/17/2017  Does Patient Have a Medical Advance Directive? No No No No No  Would patient like information on creating a medical advance directive? Yes (MAU/Ambulatory/Procedural Areas - Information given) No - Patient declined No - Patient declined No - Patient declined Yes (MAU/Ambulatory/Procedural Areas - Information given)    Current Medications (verified) Outpatient Encounter Medications as of 03/16/2020  Medication Sig  . SYNTHROID 88 MCG tablet TAKE 1 TABLET BY MOUTH ONCE DAILY BEFORE BREAKFAST  . cholestyramine (QUESTRAN) 4 g packet Take by mouth.   No facility-administered encounter medications on file as of 03/16/2020.    Allergies (verified) Patient has no known allergies.   History: Past Medical History:  Diagnosis Date  . Former smoker   .  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 Vertigo    no episodes in over 10 yrs   Past Surgical History:  Procedure Laterality Date  . COLONOSCOPY WITH PROPOFOL N/A 07/17/2017   Procedure: COLONOSCOPY WITH PROPOFOL;  Surgeon: Lin Landsman, MD;  Location: [facility];  Service: Endoscopy;  Laterality: N/A;  . COLONOSCOPY WITH PROPOFOL N/A 12/17/2018   Procedure: COLONOSCOPY WITH PROPOFOL;  Surgeon: Lin Landsman, MD;  Location: [facility] [place];  Service: Endoscopy;  Laterality: N/A;  . LAPAROSCOPIC ILEOCECECTOMY  09/2017   neuroendocrine tumor  . POLYPECTOMY  07/17/2017   Procedure: POLYPECTOMY;  Surgeon: Lin Landsman, MD;  Location: [place];  Service: Endoscopy;;  . POLYPECTOMY  12/17/2018   Procedure: POLYPECTOMY;  Surgeon: Lin Landsman, MD;  Location: [place];  Service: Endoscopy;;  . TOTAL THYROIDECTOMY  08/2017   no carcinoma   Family History  Problem Relation Age of Onset  . Stroke Mother   . Colon cancer Father   . Prostate cancer Father   . Breast cancer Maternal Aunt 70  . Liver cancer Maternal Aunt   . Stomach cancer Maternal Aunt    Social History   Socioeconomic History  . Marital status: Single    Spouse name: Not on file  . Number of children: 1  . Years of education: Not on file  . Highest education level: Not on file  Occupational History  . Occupation: Retired   Tobacco Use  . Smoking status: Current Some Day  Smoker    Packs/day: 0.25    Years: 15.00    Pack years: 3.75    Types: Cigarettes    Last attempt to quit: 08/09/2017    Years since quitting: 2.6  . Smokeless tobacco: Never Used  Vaping Use  . Vaping Use: Never used  Substance and Sexual Activity  . Alcohol use: Yes    Comment: 1-2 drinks/month  . Drug use: No  . Sexual activity: Never  Other Topics Concern  . Not on file  Social History  Narrative   Pt lives alone   Social Determinants of Health   Financial Resource Strain: Low Risk   . Difficulty of Paying Living Expenses: Not very hard  Food Insecurity: No Food Insecurity  . Worried About Charity fundraiser in the Last Year: Never true  . Ran Out of Food in the Last Year: Never true  Transportation Needs: No Transportation Needs  . Lack of Transportation (Medical): No  . Lack of Transportation (Non-Medical): No  Physical Activity: Inactive  . Days of Exercise per Week: 0 days  . Minutes of Exercise per Session: 0 min  Stress: No Stress Concern Present  . Feeling of Stress : Not at all  Social Connections: Socially Isolated  . Frequency of Communication with Friends and Family: More than three times a week  . Frequency of Social Gatherings with Friends and Family: More than three times a week  . Attends Religious Services: Never  . Active Member of Clubs or Organizations: No  . Attends Archivist Meetings: Never  . Marital Status: Never married    Tobacco Counseling Ready to quit: Not Answered Counseling given: Not Answered   Clinical Intake:  Pre-visit preparation completed: Yes  Pain : No/denies pain     Nutritional Risks: None Diabetes: No  How often do you need to have someone help you when you read instructions, pamphlets, or other written materials from your doctor or pharmacy?: 1 - Never    Interpreter Needed?: No  Information entered by :: Clemetine Marker LPN   Activities of Daily Living In your present state of health, do you have any difficulty performing the following activities: 03/16/2020  Hearing? N  Comment declines hearing aids  Vision? N  Difficulty concentrating or making decisions? N  Walking or climbing stairs? N  Dressing or bathing? N  Doing errands, shopping? N  Preparing Food and eating ? N  Using the Toilet? N  In the past six months, have you accidently leaked urine? N  Do you have problems with loss of  bowel control? N  Managing your Medications? N  Managing your Finances? N  Housekeeping or managing your Housekeeping? N  Some recent data might be hidden    Patient Care Team: Glean Hess, MD as PCP - General (Internal Medicine) Lin Landsman, MD as Consulting Physician (Gastroenterology) Aurelio Jew, MD as Referring Physician (Surgical Oncology) Gae Dry, MD as Referring Physician (Obstetrics and Gynecology) Lennox Grumbles, MD as Referring Physician (Otolaryngology)  Indicate any recent Medical Services you may have received from other than [facility] providers in the past year (date may be approximate).     Assessment:   This is a routine wellness examination for Liela.  Hearing/Vision screen  Hearing Screening   125Hz  250Hz  500Hz  1000Hz  2000Hz  3000Hz  4000Hz  6000Hz  8000Hz   Right ear:           Left ear:  Comments: Pt denies hearing difficulty  Vision Screening Comments: Annual vision screenings done at [facility] issues and exercise activities discussed: Current Exercise Habits: The patient does not participate in regular exercise at present, Exercise limited by: None identified  Goals    . Increase physical activity     Recommend increasing physical activity to at least 3 days per week      Depression Screen PHQ 2/9 Scores 03/16/2020 10/08/2019 03/03/2019 10/07/2018 01/24/2018 06/14/2017  PHQ - 2 Score 0 0 0 0 0 2  PHQ- 9 Score - 0 - - 3 8    Fall Risk Fall Risk  03/16/2020 10/08/2019 10/07/2018 06/14/2017  Falls in the past year? 0 0 0 No  Number falls in past yr: 0 0 0 -  Injury with Fall? 0 0 0 -  Risk for fall due to : No Fall Risks No Fall Risks - -  Follow up Falls prevention discussed Falls evaluation completed Falls evaluation completed -    Any stairs in or around the home? No  If so, are there any without handrails? No  Home free of loose throw rugs in walkways, pet beds, electrical cords, etc? Yes    Adequate lighting in your home to reduce risk of falls? Yes   ASSISTIVE DEVICES UTILIZED TO PREVENT FALLS:  Life alert? No  Use of a cane, walker or w/c? No  Grab bars in the bathroom? No  Shower chair or bench in shower? No  Elevated toilet seat or a handicapped toilet? Yes  TIMED UP AND GO:  Was the test performed? No . Telephonic visit.  Cognitive Function:     6CIT Screen 03/16/2020  What Year? 0 points  What month? 0 points  What time? 0 points  Count back from 20 0 points  Months in reverse 0 points  Repeat phrase 0 points  Total Score 0    Immunizations Immunization History  Administered Date(s) Administered  . Influenza, High Dose Seasonal PF 04/02/2018, 02/10/2019, 01/31/2020  . PFIZER SARS-COV-2 Vaccination 07/22/2019, 08/12/2019, 02/22/2020  . Pneumococcal Conjugate-13 06/14/2017, 04/02/2018  . Pneumococcal Polysaccharide-23 10/07/2018, 01/31/2020  . Zoster Recombinat (Shingrix) 03/13/2018, 02/10/2019    TDAP status: Due, Education has been provided regarding the importance of this vaccine. Advised may receive this vaccine at local pharmacy or Health Dept. Aware to provide a copy of the vaccination record if obtained from local pharmacy or Health Dept. Verbalized acceptance and understanding.   Flu Vaccine status: Up to date   Pneumococcal vaccine status: Up to date   Covid-19 vaccine status: Completed vaccines  Qualifies for Shingles Vaccine? Yes   Zostavax completed No   Shingrix Completed?: Yes  Screening Tests Health Maintenance  Topic Date Due  . TETANUS/TDAP  Never done  . MAMMOGRAM  11/25/2020  . COLONOSCOPY  12/16/2021  . INFLUENZA VACCINE  Completed  . DEXA SCAN  Completed  . COVID-19 Vaccine  Completed  . Hepatitis C Screening  Completed  . PNA vac Low Risk Adult  Completed    Health Maintenance  Health Maintenance Due  Topic Date Due  . TETANUS/TDAP  Never done    Colorectal cancer screening: Completed 12/17/18. Repeat every 3  years   Mammogram status: Completed 11/26/19. Repeat every year   Bone Density status: Completed 11/25/18. Results reflect: Bone density results: OSTEOPENIA. Repeat every 2 years.  Lung Cancer Screening: (Low Dose CT Chest recommended if Age 59-80 years, 30 pack-year currently smoking OR have quit w/in 15years.) does  not qualify.   Additional Screening:  Hepatitis C Screening: does qualify; Completed 10/08/19  Vision Screening: Recommended annual ophthalmology exams for early detection of glaucoma and other disorders of the eye. Is the patient up to date with their annual eye exam?  Yes  Who is the provider or what is the name of the office in which the patient attends annual eye exams? Dr. Ellin Mayhew  Dental Screening: Recommended annual dental exams for proper oral hygiene  Community Resource Referral / Chronic Care Management: CRR required this visit?  No   CCM required this visit?  No      Plan:     I have personally reviewed and noted the following in the patient's chart:   . Medical and social history . Use of alcohol, tobacco or illicit drugs  . Current medications and supplements . Functional ability and status . Nutritional status . Physical activity . Advanced directives . List of other physicians . Hospitalizations, surgeries, and ER visits in previous 12 months . Vitals . Screenings to include cognitive, depression, and falls . Referrals and appointments  In addition, I have reviewed and discussed with patient certain preventive protocols, quality metrics, and best practice recommendations. A written personalized care plan for preventive services as well as general preventive health recommendations were provided to patient.     Clemetine Marker, LPN   [id number]   Nurse Notes: none

## 2020-03-16 NOTE — Patient Instructions (Signed)
Kristin Escobar , Thank you for taking time to come for your Medicare Wellness Visit. I appreciate your ongoing commitment to your health goals. Please review the following plan we discussed and let me know if I can assist you in the future.   Screening recommendations/referrals: Colonoscopy: done 12/17/18. Repeat in 2023 Mammogram: done 11/26/18  Bone Density: done 11/25/18 Recommended yearly ophthalmology/optometry visit for glaucoma screening and checkup Recommended yearly dental visit for hygiene and checkup  Vaccinations: Influenza vaccine: done 01/31/20 Pneumococcal vaccine: done 01/31/20 Tdap vaccine: due Shingles vaccine: done 03/13/18 & 02/10/19   Covid-19:done 07/22/19, 08/12/19 & 02/22/20  Advanced directives: Advance directive discussed with you today. I have provided a copy for you to complete at home and have notarized. Once this is complete please bring a copy in to our office so we can scan it into your chart.  Conditions/risks identified: Recommend increasing physical activity  Next appointment: Follow up in one year for your annual wellness visit    Preventive Care 65 Years and Older, Female Preventive care refers to lifestyle choices and visits with your health care provider that can promote health and wellness. What does preventive care include?  A yearly physical exam. This is also called an annual well check.  Dental exams once or twice a year.  Routine eye exams. Ask your health care provider how often you should have your eyes checked.  Personal lifestyle choices, including:  Daily care of your teeth and gums.  Regular physical activity.  Eating a healthy diet.  Avoiding tobacco and drug use.  Limiting alcohol use.  Practicing safe sex.  Taking low-dose aspirin every day.  Taking vitamin and mineral supplements as recommended by your health care provider. What happens during an annual well check? The services and screenings done by your health care provider  during your annual well check will depend on your age, overall health, lifestyle risk factors, and family history of disease. Counseling  Your health care provider may ask you questions about your:  Alcohol use.  Tobacco use.  Drug use.  Emotional well-being.  Home and relationship well-being.  Sexual activity.  Eating habits.  History of falls.  Memory and ability to understand (cognition).  Work and work Statistician.  Reproductive health. Screening  You may have the following tests or measurements:  Height, weight, and BMI.  Blood pressure.  Lipid and cholesterol levels. These may be checked every 5 years, or more frequently if you are over 25 years old.  Skin check.  Lung cancer screening. You may have this screening every year starting at age 44 if you have a 30-pack-year history of smoking and currently smoke or have quit within the past 15 years.  Fecal occult blood test (FOBT) of the stool. You may have this test every year starting at age 75.  Flexible sigmoidoscopy or colonoscopy. You may have a sigmoidoscopy every 5 years or a colonoscopy every 10 years starting at age 40.  Hepatitis C blood test.  Hepatitis B blood test.  Sexually transmitted disease (STD) testing.  Diabetes screening. This is done by checking your blood sugar (glucose) after you have not eaten for a while (fasting). You may have this done every 1-3 years.  Bone density scan. This is done to screen for osteoporosis. You may have this done starting at age 13.  Mammogram. This may be done every 1-2 years. Talk to your health care provider about how often you should have regular mammograms. Talk with your health care provider  about your test results, treatment options, and if necessary, the need for more tests. Vaccines  Your health care provider may recommend certain vaccines, such as:  Influenza vaccine. This is recommended every year.  Tetanus, diphtheria, and acellular pertussis  (Tdap, Td) vaccine. You may need a Td booster every 10 years.  Zoster vaccine. You may need this after age 56.  Pneumococcal 13-valent conjugate (PCV13) vaccine. One dose is recommended after age 40.  Pneumococcal polysaccharide (PPSV23) vaccine. One dose is recommended after age 103. Talk to your health care provider about which screenings and vaccines you need and how often you need them. This information is not intended to replace advice given to you by your health care provider. Make sure you discuss any questions you have with your health care provider. Document Released: 06/10/2015 Document Revised: 02/01/2016 Document Reviewed: 03/15/2015 Elsevier Interactive Patient Education  2017 [place] Prevention in the Home Falls can cause injuries. They can happen to people of all ages. There are many things you can do to make your home safe and to help prevent falls. What can I do on the outside of my home?  Regularly fix the edges of walkways and driveways and fix any cracks.  Remove anything that might make you trip as you walk through a door, such as a raised step or threshold.  Trim any bushes or trees on the path to your home.  Use bright outdoor lighting.  Clear any walking paths of anything that might make someone trip, such as rocks or tools.  Regularly check to see if handrails are loose or broken. Make sure that both sides of any steps have handrails.  Any raised decks and porches should have guardrails on the edges.  Have any leaves, snow, or ice cleared regularly.  Use sand or salt on walking paths during winter.  Clean up any spills in your garage right away. This includes oil or grease spills. What can I do in the bathroom?  Use night lights.  Install grab bars by the toilet and in the tub and shower. Do not use towel bars as grab bars.  Use non-skid mats or decals in the tub or shower.  If you need to sit down in the shower, use a plastic, non-slip  stool.  Keep the floor dry. Clean up any water that spills on the floor as soon as it happens.  Remove soap buildup in the tub or shower regularly.  Attach bath mats securely with double-sided non-slip rug tape.  Do not have throw rugs and other things on the floor that can make you trip. What can I do in the bedroom?  Use night lights.  Make sure that you have a light by your bed that is easy to reach.  Do not use any sheets or blankets that are too big for your bed. They should not hang down onto the floor.  Have a firm chair that has side arms. You can use this for support while you get dressed.  Do not have throw rugs and other things on the floor that can make you trip. What can I do in the kitchen?  Clean up any spills right away.  Avoid walking on wet floors.  Keep items that you use a lot in easy-to-reach places.  If you need to reach something above you, use a strong step stool that has a grab bar.  Keep electrical cords out of the way.  Do not use floor polish or  wax that makes floors slippery. If you must use wax, use non-skid floor wax.  Do not have throw rugs and other things on the floor that can make you trip. What can I do with my stairs?  Do not leave any items on the stairs.  Make sure that there are handrails on both sides of the stairs and use them. Fix handrails that are broken or loose. Make sure that handrails are as long as the stairways.  Check any carpeting to make sure that it is firmly attached to the stairs. Fix any carpet that is loose or worn.  Avoid having throw rugs at the top or bottom of the stairs. If you do have throw rugs, attach them to the floor with carpet tape.  Make sure that you have a light switch at the top of the stairs and the bottom of the stairs. If you do not have them, ask someone to add them for you. What else can I do to help prevent falls?  Wear shoes that:  Do not have high heels.  Have rubber bottoms.  Are  comfortable and fit you well.  Are closed at the toe. Do not wear sandals.  If you use a stepladder:  Make sure that it is fully opened. Do not climb a closed stepladder.  Make sure that both sides of the stepladder are locked into place.  Ask someone to hold it for you, if possible.  Clearly mark and make sure that you can see:  Any grab bars or handrails.  First and last steps.  Where the edge of each step is.  Use tools that help you move around (mobility aids) if they are needed. These include:  Canes.  Walkers.  Scooters.  Crutches.  Turn on the lights when you go into a dark area. Replace any light bulbs as soon as they burn out.  Set up your furniture so you have a clear path. Avoid moving your furniture around.  If any of your floors are uneven, fix them.  If there are any pets around you, be aware of where they are.  Review your medicines with your doctor. Some medicines can make you feel dizzy. This can increase your chance of falling. Ask your doctor what other things that you can do to help prevent falls. This information is not intended to replace advice given to you by your health care provider. Make sure you discuss any questions you have with your health care provider. Document Released: 03/10/2009 Document Revised: 10/20/2015 Document Reviewed: 06/18/2014 Elsevier Interactive Patient Education  2017 [employer].

## 2020-03-25 DIAGNOSIS — Z6829 Body mass index (BMI) 29.0-29.9, adult: Secondary | ICD-10-CM | POA: Diagnosis not present

## 2020-03-25 DIAGNOSIS — C7A8 Other malignant neuroendocrine tumors: Secondary | ICD-10-CM | POA: Diagnosis not present

## 2020-03-25 DIAGNOSIS — Z08 Encounter for follow-up examination after completed treatment for malignant neoplasm: Secondary | ICD-10-CM | POA: Diagnosis not present

## 2020-03-25 DIAGNOSIS — Z859 Personal history of malignant neoplasm, unspecified: Secondary | ICD-10-CM | POA: Diagnosis not present

## 2020-06-15 ENCOUNTER — Other Ambulatory Visit: Payer: Self-pay | Admitting: Internal Medicine

## 2020-06-15 DIAGNOSIS — E890 Postprocedural hypothyroidism: Secondary | ICD-10-CM

## 2020-06-15 MED ORDER — SYNTHROID 88 MCG PO TABS: ORAL_TABLET | ORAL | 0 refills | Status: DC

## 2020-06-15 NOTE — Telephone Encounter (Signed)
Medication Refill - Medication: SYNTHROID 88 MCG tablet   Has the patient contacted their pharmacy? No. (Agent: If no, request that the patient contact the pharmacy for the refill.) (Agent: If yes, when and what did the pharmacy advise?)  Preferred Pharmacy (with phone number or street name): [facility] ([address]), [place] [facility] [address], [place] ([place]) [place] [address]  Phone:  [phone] Fax:  [phone]   Agent: Please be advised that RX refills may take up to 3 business days. We ask that you follow-up with your pharmacy.

## 2020-09-16 DIAGNOSIS — C7A8 Other malignant neuroendocrine tumors: Secondary | ICD-10-CM | POA: Diagnosis not present

## 2020-09-16 DIAGNOSIS — Z6828 Body mass index (BMI) 28.0-28.9, adult: Secondary | ICD-10-CM | POA: Diagnosis not present

## 2020-09-16 DIAGNOSIS — Z859 Personal history of malignant neoplasm, unspecified: Secondary | ICD-10-CM | POA: Diagnosis not present

## 2020-09-16 DIAGNOSIS — Z08 Encounter for follow-up examination after completed treatment for malignant neoplasm: Secondary | ICD-10-CM | POA: Diagnosis not present

## 2020-09-20 ENCOUNTER — Other Ambulatory Visit: Payer: Self-pay | Admitting: Internal Medicine

## 2020-09-20 DIAGNOSIS — E890 Postprocedural hypothyroidism: Secondary | ICD-10-CM

## 2020-09-21 ENCOUNTER — Other Ambulatory Visit (HOSPITAL_COMMUNITY): Payer: Self-pay | Admitting: Nurse Practitioner

## 2020-09-21 DIAGNOSIS — C7A8 Other malignant neuroendocrine tumors: Secondary | ICD-10-CM

## 2020-09-21 DIAGNOSIS — Z08 Encounter for follow-up examination after completed treatment for malignant neoplasm: Secondary | ICD-10-CM

## 2020-09-21 DIAGNOSIS — Z8589 Personal history of malignant neoplasm of other organs and systems: Secondary | ICD-10-CM

## 2020-10-11 NOTE — Progress Notes (Signed)
Date:  10/12/2020   Name:  Kristin Escobar   DOB:  06/30/1950   MRN:  [id number]   Chief Complaint: Annual Exam (Breast Exam. No pap. )  Kristin Escobar is a 70 y.o. female who presents today for her Complete Annual Exam. She feels fairly well. She reports exercising - walking. She reports she is sleeping fairly well. Breast complaints - none.  Mammogram: 11/2019 DEXA: 10/2018 Osteopenia Pap smear: followed by GYN for HPV+ in 2018 Colonoscopy: 11/2018 repeat 3 yrs  Immunization History  Administered Date(s) Administered  . Influenza, High Dose Seasonal PF 04/02/2018, 02/10/2019, 01/31/2020  . PFIZER(Purple Top)SARS-COV-2 Vaccination 07/22/2019, 08/12/2019, 02/22/2020  . Pneumococcal Conjugate-13 06/14/2017, 04/02/2018  . Pneumococcal Polysaccharide-23 10/07/2018, 01/31/2020  . Zoster Recombinat (Shingrix) 03/13/2018, 02/10/2019    Thyroid Problem Presents for follow-up visit. Patient reports no anxiety, constipation, diarrhea, fatigue, palpitations or tremors. The symptoms have been stable.  Diabetes She presents for her follow-up diabetic visit. Diabetes type: prediabetes. Her disease course has been stable. Pertinent negatives for hypoglycemia include no dizziness, headaches, nervousness/anxiousness or tremors. Pertinent negatives for diabetes include no chest pain, no fatigue, no polydipsia and no polyuria.  Gastroesophageal Reflux She complains of heartburn. She reports no abdominal pain, no chest pain, no coughing or no wheezing. The problem occurs occasionally. Pertinent negatives include no fatigue. She has tried an antibiotic for the symptoms. The treatment provided significant relief.    Lab Results  Component Value Date   CREATININE 1.02 (H) 10/08/2019   BUN 17 10/08/2019   NA 140 10/08/2019   K 4.4 10/08/2019   CL 101 10/08/2019   CO2 25 10/08/2019   Lab Results  Component Value Date   CHOL 207 (H) 10/08/2019   HDL 51 10/08/2019   LDLCALC 106 (H) 10/08/2019    TRIG 295 (H) 10/08/2019   CHOLHDL 4.1 10/08/2019   Lab Results  Component Value Date   TSH 5.040 (H) 10/08/2019   Lab Results  Component Value Date   HGBA1C 5.6 10/08/2019   Lab Results  Component Value Date   WBC 5.9 10/08/2019   HGB 14.7 10/08/2019   HCT 42.7 10/08/2019   MCV 96 10/08/2019   PLT 247 10/08/2019   Lab Results  Component Value Date   ALT 25 10/08/2019   AST 22 10/08/2019   ALKPHOS 101 10/08/2019   BILITOT 0.4 10/08/2019     Review of Systems  Constitutional: Negative for chills, fatigue and fever.  HENT: Negative for congestion, hearing loss, tinnitus, trouble swallowing and voice change.   Eyes: Positive for visual disturbance.  Respiratory: Negative for cough, chest tightness, shortness of breath and wheezing.   Cardiovascular: Negative for chest pain, palpitations and leg swelling.  Gastrointestinal: Positive for heartburn. Negative for abdominal pain, constipation, diarrhea and vomiting.       Reflux  Endocrine: Negative for polydipsia and polyuria.  Genitourinary: Negative for dysuria, frequency, genital sores, vaginal bleeding and vaginal discharge.  Musculoskeletal: Positive for myalgias. Negative for arthralgias, gait problem and joint swelling.  Skin: Negative for color change and rash.  Neurological: Negative for dizziness, tremors, light-headedness and headaches.  Hematological: Negative for adenopathy. Does not bruise/bleed easily.  Psychiatric/Behavioral: Positive for dysphoric mood (due to issues with daughter). Negative for sleep disturbance. The patient is not nervous/anxious.     Patient Active Problem List   Diagnosis Date Noted  . Mixed hyperlipidemia 10/08/2019  . Postoperative hypothyroidism 01/24/2018  . Goiter 08/08/2017  . 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 Tobacco use disorder 07/12/2017  . Vitamin D deficiency 06/14/2017  . Gait instability 06/14/2017  . Depression 06/14/2017  . Insomnia  06/14/2017  . Obesity (BMI 30.0-34.9) 06/14/2017  . Prediabetes 06/14/2017  . 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 Nocturnal leg cramps 06/14/2017    No Known Allergies  Past Surgical History:  Procedure Laterality Date  . COLONOSCOPY WITH PROPOFOL N/A 07/17/2017   Procedure: COLONOSCOPY WITH PROPOFOL;  Surgeon: Lin Landsman, MD;  Location: [facility];  Service: Endoscopy;  Laterality: N/A;  . COLONOSCOPY WITH PROPOFOL N/A 12/17/2018   Procedure: COLONOSCOPY WITH PROPOFOL;  Surgeon: Lin Landsman, MD;  Location: [facility];  Service: Endoscopy;  Laterality: N/A;  . LAPAROSCOPIC ILEOCECECTOMY  09/2017   neuroendocrine tumor  . POLYPECTOMY  07/17/2017   Procedure: POLYPECTOMY;  Surgeon: Lin Landsman, MD;  Location: [facility];  Service: Endoscopy;;  . POLYPECTOMY  12/17/2018   Procedure: POLYPECTOMY;  Surgeon: Lin Landsman, MD;  Location: [facility];  Service: Endoscopy;;  . TOTAL THYROIDECTOMY  08/2017   no carcinoma    Social History   Tobacco Use  . Smoking status: Current Some Day Smoker    Packs/day: 0.25    Years: 15.00    Pack years: 3.75    Types: Cigarettes    Last attempt to quit: 08/09/2017    Years since quitting: 3.1  . Smokeless tobacco: Never Used  Vaping Use  . Vaping Use: Never used  Substance Use Topics  . Alcohol use: Yes    Comment: 1-2 drinks/month  . Drug use: No     Medication list has been reviewed and updated.  Current Meds  Medication Sig  . cholestyramine (QUESTRAN) 4 g packet Take by mouth.  . SYNTHROID 88 MCG tablet TAKE 1 TABLET BY MOUTH ONCE DAILY BEFORE BREAKFAST    PHQ 2/9 Scores 10/12/2020 03/16/2020 10/08/2019 03/03/2019  PHQ - 2 Score 1 0 0 0  PHQ- 9 Score 6 - 0 -    GAD 7 : Generalized Anxiety Score 10/12/2020 10/08/2019  Nervous, Anxious, on Edge 0 0  Control/stop worrying 1 0  Worry too much - different things 1 0  Trouble relaxing 0 0  Restless 0 0   Easily annoyed or irritable 0 0  Afraid - awful might happen 0 0  Total GAD 7 Score 2 0  Anxiety Difficulty - Not difficult at all    BP Readings from Last 3 Encounters:  10/12/20 120/70  10/08/19 108/62  03/03/19 104/64    Physical Exam Vitals and nursing note reviewed.  Constitutional:      General: She is not in acute distress.    Appearance: She is well-developed.  HENT:     Head: Normocephalic and atraumatic.     Right Ear: Tympanic membrane and ear canal normal.     Left Ear: Tympanic membrane and ear canal normal.     Nose:     Right Sinus: No maxillary sinus tenderness.     Left Sinus: No maxillary sinus tenderness.  Eyes:     General: No scleral icterus.       Right eye: No discharge.        Left eye: No discharge.     Conjunctiva/sclera: Conjunctivae normal.  Neck:     Vascular: No carotid bruit.     Comments: Upper chest thyroidectomy scar present Cardiovascular:     Rate and Rhythm: Normal rate and regular rhythm.  Pulses: Normal pulses.     Heart sounds: Normal heart sounds.  Pulmonary:     Effort: Pulmonary effort is normal. No respiratory distress.     Breath sounds: No wheezing.  Chest:  Breasts:     Right: No mass, nipple discharge, skin change or tenderness.     Left: No mass, nipple discharge, skin change or tenderness.    Abdominal:     General: Bowel sounds are normal.     Palpations: Abdomen is soft.     Tenderness: There is no abdominal tenderness.  Musculoskeletal:     Cervical back: Normal range of motion. No erythema.     Right lower leg: No edema.     Left lower leg: No edema.  Lymphadenopathy:     Cervical: No cervical adenopathy.  Skin:    General: Skin is warm and dry.     Findings: No rash.  Neurological:     Mental Status: She is alert and oriented to person, place, and time.     Cranial Nerves: No cranial nerve deficit.     Sensory: No sensory deficit.     Deep Tendon Reflexes: Reflexes are normal and symmetric.   Psychiatric:        Attention and Perception: Attention normal.        Mood and Affect: Mood normal.        Behavior: Behavior normal.     Wt Readings from Last 3 Encounters:  10/12/20 159 lb 3.2 oz (72.2 kg)  10/08/19 164 lb (74.4 kg)  03/03/19 162 lb (73.5 kg)    BP 120/70   Pulse 70   Temp 98.4 F (36.9 C) (Oral)   Ht 5' 2.5" (1.588 m)   Wt 159 lb 3.2 oz (72.2 kg)   SpO2 95%   BMI 28.65 kg/m   Assessment and Plan: 1. Annual physical exam Normal exam; mild weight loss is not concerning at this time. Continue healthy diet; begin more regular exercise - CBC with Differential/Platelet - Comprehensive metabolic panel - Lipid panel  2. Encounter for screening mammogram for breast cancer Due in July at [facility]; Future  3. Postoperative hypothyroidism Supplemented - will obtain labs and advise - TSH + free T4  4. Neuroendocrine carcinoma of small bowel ([place]) PET scan due - will schedule Follow up with Oncology and then GI for repeat colonoscopy  5. Prediabetes Check labs and advise - Hemoglobin A1c  6. Vitamin D deficiency Last levels were low - if still low will advise high dose weekly - VITAMIN D 25 Hydroxy (Vit-D Deficiency, Fractures)  7. Osteopenia determined by x-ray Due for repeat DEXA - VITAMIN D 25 Hydroxy (Vit-D Deficiency, Fractures) - DG Bone Density; Future   Partially dictated using Editor, commissioning. Any errors are unintentional.  Halina Maidens, MD [facility]  10/12/2020

## 2020-10-12 ENCOUNTER — Ambulatory Visit (INDEPENDENT_AMBULATORY_CARE_PROVIDER_SITE_OTHER): Payer: PPO | Admitting: Internal Medicine

## 2020-10-12 ENCOUNTER — Other Ambulatory Visit: Payer: Self-pay

## 2020-10-12 ENCOUNTER — Encounter: Payer: Self-pay | Admitting: Internal Medicine

## 2020-10-12 VITALS — BP 120/70 | HR 70 | Temp 98.4°F | Ht 62.5 in | Wt 159.2 lb

## 2020-10-12 DIAGNOSIS — C7A8 Other malignant neuroendocrine tumors: Secondary | ICD-10-CM | POA: Diagnosis not present

## 2020-10-12 DIAGNOSIS — R7303 Prediabetes: Secondary | ICD-10-CM

## 2020-10-12 DIAGNOSIS — M8580 Other specified disorders of bone density and structure, unspecified site: Secondary | ICD-10-CM | POA: Diagnosis not present

## 2020-10-12 DIAGNOSIS — E890 Postprocedural hypothyroidism: Secondary | ICD-10-CM | POA: Diagnosis not present

## 2020-10-12 DIAGNOSIS — Z0000 Encounter for general adult medical examination without abnormal findings: Secondary | ICD-10-CM | POA: Diagnosis not present

## 2020-10-12 DIAGNOSIS — E559 Vitamin D deficiency, unspecified: Secondary | ICD-10-CM

## 2020-10-12 DIAGNOSIS — Z1231 Encounter for screening mammogram for malignant neoplasm of breast: Secondary | ICD-10-CM

## 2020-10-13 LAB — CBC WITH DIFFERENTIAL/PLATELET
Basophils Absolute: 0 10*3/uL (ref 0.0–0.2)
Basos: 1 %
EOS (ABSOLUTE): 0.2 10*3/uL (ref 0.0–0.4)
Eos: 4 %
Hematocrit: 41 % (ref 34.0–46.6)
Hemoglobin: 13.7 g/dL (ref 11.1–15.9)
Immature Grans (Abs): 0 10*3/uL (ref 0.0–0.1)
Immature Granulocytes: 0 %
Lymphocytes Absolute: 1.7 10*3/uL (ref 0.7–3.1)
Lymphs: 36 %
MCH: 31.9 pg (ref 26.6–33.0)
MCHC: 33.4 g/dL (ref 31.5–35.7)
MCV: 95 fL (ref 79–97)
Monocytes Absolute: 0.4 10*3/uL (ref 0.1–0.9)
Monocytes: 8 %
Neutrophils Absolute: 2.4 10*3/uL (ref 1.4–7.0)
Neutrophils: 51 %
Platelets: 210 10*3/uL (ref 150–450)
RBC: 4.3 x10E6/uL (ref 3.77–5.28)
RDW: 13.1 % (ref 11.7–15.4)
WBC: 4.6 10*3/uL (ref 3.4–10.8)

## 2020-10-13 LAB — COMPREHENSIVE METABOLIC PANEL
ALT: 22 IU/L (ref 0–32)
AST: 21 IU/L (ref 0–40)
Albumin/Globulin Ratio: 1.6 (ref 1.2–2.2)
Albumin: 4.1 g/dL (ref 3.8–4.8)
Alkaline Phosphatase: 93 IU/L (ref 44–121)
BUN/Creatinine Ratio: 13 (ref 12–28)
BUN: 12 mg/dL (ref 8–27)
Bilirubin Total: 0.6 mg/dL (ref 0.0–1.2)
CO2: 25 mmol/L (ref 20–29)
Calcium: 8.8 mg/dL (ref 8.7–10.3)
Chloride: 101 mmol/L (ref 96–106)
Creatinine, Ser: 0.9 mg/dL (ref 0.57–1.00)
Globulin, Total: 2.6 g/dL (ref 1.5–4.5)
Glucose: 92 mg/dL (ref 65–99)
Potassium: 4.1 mmol/L (ref 3.5–5.2)
Sodium: 139 mmol/L (ref 134–144)
Total Protein: 6.7 g/dL (ref 6.0–8.5)
eGFR: 69 mL/min/{1.73_m2} (ref >59)

## 2020-10-13 LAB — LIPID PANEL
Chol/HDL Ratio: 3.5 ratio (ref 0.0–4.4)
Cholesterol, Total: 196 mg/dL (ref 100–199)
HDL: 56 mg/dL (ref >39)
LDL Chol Calc (NIH): 116 mg/dL — ABNORMAL HIGH (ref 0–99)
Triglycerides: 135 mg/dL (ref 0–149)
VLDL Cholesterol Cal: 24 mg/dL (ref 5–40)

## 2020-10-13 LAB — HEMOGLOBIN A1C
Est. average glucose Bld gHb Est-mCnc: 114 mg/dL
Hgb A1c MFr Bld: 5.6 % (ref 4.8–5.6)

## 2020-10-13 LAB — VITAMIN D 25 HYDROXY (VIT D DEFICIENCY, FRACTURES): Vit D, 25-Hydroxy: 20.9 ng/mL — ABNORMAL LOW (ref 30.0–100.0)

## 2020-10-13 LAB — TSH+FREE T4
Free T4: 1.44 ng/dL (ref 0.82–1.77)
TSH: 10.2 u[IU]/mL — ABNORMAL HIGH (ref 0.450–4.500)

## 2020-10-17 ENCOUNTER — Telehealth: Payer: Self-pay

## 2020-10-17 NOTE — Telephone Encounter (Signed)
Copied from [place] [id number]. Topic: General - Call Back - No Documentation >> Oct 17, 2020  1:42 PM Erick Blinks wrote: Reason for CRM: Pt called requesting to speak to a nurse, she declined to share any details when asked.  Best contact: [phone]

## 2020-10-17 NOTE — Telephone Encounter (Signed)
Spoke to pt. Pt wanted a note for [employer]. Pt stated she had a bone density and mammogram on 11/01/2020. Madaline Savage duty is on 11/01/2020. Told pt that we could not give her a letter to excuse her from jury duty pt has no health related problems to excuse her from jury duty. Pt verbalized understanding.  KP

## 2020-10-21 ENCOUNTER — Other Ambulatory Visit: Payer: Self-pay

## 2020-10-21 ENCOUNTER — Ambulatory Visit (HOSPITAL_COMMUNITY)
Admission: RE | Admit: 2020-10-21 | Discharge: 2020-10-21 | Disposition: A | Discharge status: Home / Self Care | Payer: PPO | Source: Ambulatory Visit | Attending: Nurse Practitioner | Admitting: Nurse Practitioner

## 2020-10-21 DIAGNOSIS — C7A8 Other malignant neuroendocrine tumors: Secondary | ICD-10-CM | POA: Diagnosis not present

## 2020-10-21 DIAGNOSIS — Z859 Personal history of malignant neoplasm, unspecified: Secondary | ICD-10-CM | POA: Diagnosis not present

## 2020-10-21 DIAGNOSIS — Z08 Encounter for follow-up examination after completed treatment for malignant neoplasm: Secondary | ICD-10-CM | POA: Insufficient documentation

## 2020-10-21 MED ORDER — GALLIUM GA 68 DOTATATE IV KIT
4.2000 | PACK | Freq: Once | INTRAVENOUS | Status: AC | PRN
  Administered 2020-10-21: 4.2 via INTRAVENOUS

## 2020-10-25 ENCOUNTER — Other Ambulatory Visit: Payer: Self-pay | Admitting: Internal Medicine

## 2020-10-25 DIAGNOSIS — E890 Postprocedural hypothyroidism: Secondary | ICD-10-CM

## 2020-10-25 MED ORDER — SYNTHROID 88 MCG PO TABS: 88.0000 ug | ORAL_TABLET | Freq: Every day | ORAL | 0 refills | Status: DC

## 2020-10-25 NOTE — Telephone Encounter (Signed)
Medication Refill - Medication:   SYNTHROID 88 MCG tablet   Has the patient contacted their pharmacy? Yes.  Pt wants a 90 day supply.   Preferred Pharmacy (with phone number or street name):   [facility] (N), [place] - [place] ([place]) [place] [address]  Phone: [phone] Fax: [phone]    Agent: Please be advised that RX refills may take up to 3 business days. We ask that you follow-up with your pharmacy.

## 2020-10-25 NOTE — Telephone Encounter (Signed)
Requested medication (s) are due for refill today - yes  Requested medication (s) are on the active medication list -yes  Future visit scheduled -yes  Last refill: 1 month  Notes to clinic: Patient faiuls lab protocol for refill- sent for PCP review   Requested Prescriptions  Pending Prescriptions Disp Refills   SYNTHROID 88 MCG tablet 30 tablet 0    Sig: Take 1 tablet (88 mcg total) by mouth daily before breakfast.      Endocrinology:  Hypothyroid Agents Failed - 10/25/2020 10:36 AM      Failed - TSH needs to be rechecked within 3 months after an abnormal result. Refill until TSH is due.      Failed - TSH in normal range and within 360 days    TSH  Date Value Ref Range Status  10/12/2020 10.200 (H) 0.450 - 4.500 uIU/mL Final          Passed - Valid encounter within last 12 months    Recent Outpatient Visits           1 week ago Annual physical exam   [facility] Glean Hess, MD   1 year ago Annual physical exam   [facility] Glean Hess, MD   1 year ago Other fatigue   [facility] Glean Hess, MD   2 years ago Annual physical exam   [facility] Glean Hess, MD   2 years ago Postoperative hypothyroidism   [facility] Glean Hess, MD       Future Appointments             In 5 months Army Melia Jesse Sans, MD [facility], [place]   In 11 months Glean Hess, MD [facility], [facility]                 Requested Prescriptions  Pending Prescriptions Disp Refills   SYNTHROID 88 MCG tablet 30 tablet 0    Sig: Take 1 tablet (88 mcg total) by mouth daily before breakfast.      Endocrinology:  Hypothyroid Agents Failed - 10/25/2020 10:36 AM      Failed - TSH needs to be rechecked within 3 months after an abnormal result. Refill until TSH is due.      Failed - TSH in normal range and within 360 days    TSH  Date Value Ref Range Status  10/12/2020 10.200 (H) 0.450 - 4.500  uIU/mL Final          Passed - Valid encounter within last 12 months    Recent Outpatient Visits           1 week ago Annual physical exam   [facility] Glean Hess, MD   1 year ago Annual physical exam   [facility] Glean Hess, MD   1 year ago Other fatigue   [facility] Glean Hess, MD   2 years ago Annual physical exam   [facility] Glean Hess, MD   2 years ago Postoperative hypothyroidism   [facility] Glean Hess, MD       Future Appointments             In 5 months Army Melia Jesse Sans, MD [facility], [place]   In 11 months Army Melia Jesse Sans, MD [facility], [facility]

## 2020-11-04 DIAGNOSIS — H25013 Cortical age-related cataract, bilateral: Secondary | ICD-10-CM | POA: Diagnosis not present

## 2020-11-04 DIAGNOSIS — H2513 Age-related nuclear cataract, bilateral: Secondary | ICD-10-CM | POA: Diagnosis not present

## 2020-11-04 DIAGNOSIS — H5203 Hypermetropia, bilateral: Secondary | ICD-10-CM | POA: Diagnosis not present

## 2020-11-04 DIAGNOSIS — H43812 Vitreous degeneration, left eye: Secondary | ICD-10-CM | POA: Diagnosis not present

## 2020-12-01 ENCOUNTER — Other Ambulatory Visit: Payer: Self-pay

## 2020-12-01 ENCOUNTER — Ambulatory Visit
Admission: RE | Admit: 2020-12-01 | Discharge: 2020-12-01 | Disposition: A | Discharge status: Home / Self Care | Payer: Medicare HMO | Source: Ambulatory Visit | Attending: Internal Medicine | Admitting: Internal Medicine

## 2020-12-01 DIAGNOSIS — Z1231 Encounter for screening mammogram for malignant neoplasm of breast: Secondary | ICD-10-CM | POA: Insufficient documentation

## 2020-12-01 DIAGNOSIS — M85852 Other specified disorders of bone density and structure, left thigh: Secondary | ICD-10-CM | POA: Diagnosis not present

## 2020-12-01 DIAGNOSIS — Z780 Asymptomatic menopausal state: Secondary | ICD-10-CM | POA: Diagnosis not present

## 2020-12-01 DIAGNOSIS — M8580 Other specified disorders of bone density and structure, unspecified site: Secondary | ICD-10-CM

## 2020-12-01 DIAGNOSIS — M85851 Other specified disorders of bone density and structure, right thigh: Secondary | ICD-10-CM | POA: Diagnosis not present

## 2020-12-01 NOTE — Progress Notes (Signed)
Pt verbalized understanding.  KP 

## 2020-12-02 ENCOUNTER — Telehealth: Payer: Self-pay | Admitting: Internal Medicine

## 2020-12-02 NOTE — Telephone Encounter (Signed)
Pt is calling regarding her [facility] DEXA Was advised to take Vitamin D and calicum. Pt is requesting a script on Vit D and Calcium. Pt report taking OTC Vit D - 2000 and it does not seem to be helping. Pt is wanting to know does she need to pick up something else from the vitamin store? Please advise CB- [phone]

## 2020-12-05 DIAGNOSIS — H35362 Drusen (degenerative) of macula, left eye: Secondary | ICD-10-CM | POA: Diagnosis not present

## 2020-12-05 DIAGNOSIS — H52213 Irregular astigmatism, bilateral: Secondary | ICD-10-CM | POA: Diagnosis not present

## 2020-12-05 DIAGNOSIS — H2513 Age-related nuclear cataract, bilateral: Secondary | ICD-10-CM | POA: Diagnosis not present

## 2020-12-05 DIAGNOSIS — H524 Presbyopia: Secondary | ICD-10-CM | POA: Diagnosis not present

## 2020-12-05 DIAGNOSIS — H2512 Age-related nuclear cataract, left eye: Secondary | ICD-10-CM | POA: Diagnosis not present

## 2020-12-05 DIAGNOSIS — H35013 Changes in retinal vascular appearance, bilateral: Secondary | ICD-10-CM | POA: Diagnosis not present

## 2020-12-05 DIAGNOSIS — H2589 Other age-related cataract: Secondary | ICD-10-CM | POA: Diagnosis not present

## 2020-12-05 DIAGNOSIS — H35033 Hypertensive retinopathy, bilateral: Secondary | ICD-10-CM | POA: Diagnosis not present

## 2020-12-05 DIAGNOSIS — H40013 Open angle with borderline findings, low risk, bilateral: Secondary | ICD-10-CM | POA: Diagnosis not present

## 2020-12-05 DIAGNOSIS — H3562 Retinal hemorrhage, left eye: Secondary | ICD-10-CM | POA: Diagnosis not present

## 2020-12-05 DIAGNOSIS — H25013 Cortical age-related cataract, bilateral: Secondary | ICD-10-CM | POA: Diagnosis not present

## 2020-12-05 NOTE — Telephone Encounter (Signed)
Called and left patient a VM informing her that per her DEXA scan her scan was stable from last time so she does not need to change anything she is doing. Explained to her she needs to continue her same VIT D and Calcium. Told her to call back with any further questions.   [facility] nurse may give note to patient if they return call to clinic, a CRM has been created.

## 2020-12-27 DIAGNOSIS — H2512 Age-related nuclear cataract, left eye: Secondary | ICD-10-CM | POA: Diagnosis not present

## 2020-12-27 DIAGNOSIS — H25812 Combined forms of age-related cataract, left eye: Secondary | ICD-10-CM | POA: Diagnosis not present

## 2020-12-27 DIAGNOSIS — H2589 Other age-related cataract: Secondary | ICD-10-CM | POA: Diagnosis not present

## 2021-01-04 DIAGNOSIS — H2512 Age-related nuclear cataract, left eye: Secondary | ICD-10-CM | POA: Diagnosis not present

## 2021-01-16 DIAGNOSIS — H25011 Cortical age-related cataract, right eye: Secondary | ICD-10-CM | POA: Diagnosis not present

## 2021-01-16 DIAGNOSIS — H2511 Age-related nuclear cataract, right eye: Secondary | ICD-10-CM | POA: Diagnosis not present

## 2021-01-24 ENCOUNTER — Other Ambulatory Visit: Payer: Self-pay | Admitting: Internal Medicine

## 2021-01-24 DIAGNOSIS — E890 Postprocedural hypothyroidism: Secondary | ICD-10-CM

## 2021-01-24 DIAGNOSIS — H25811 Combined forms of age-related cataract, right eye: Secondary | ICD-10-CM | POA: Diagnosis not present

## 2021-01-24 DIAGNOSIS — H2511 Age-related nuclear cataract, right eye: Secondary | ICD-10-CM | POA: Diagnosis not present

## 2021-01-24 DIAGNOSIS — H25011 Cortical age-related cataract, right eye: Secondary | ICD-10-CM | POA: Diagnosis not present

## 2021-01-24 MED ORDER — SYNTHROID 88 MCG PO TABS: 88.0000 ug | ORAL_TABLET | Freq: Every day | ORAL | 2 refills | Status: DC

## 2021-01-24 NOTE — Telephone Encounter (Signed)
Pt is completely out of her synthroid. Pt seems anxious and hopes to get her medication today.

## 2021-01-24 NOTE — Telephone Encounter (Signed)
Medication Refill - Medication:  SYNTHROID 88 MCG tablet -90 tablet  Has the patient contacted their pharmacy? No.  Preferred Pharmacy (with phone number or street name):  [facility] (N), [place] - [place], [place]) [place] [address]  Phone:  [phone]  Fax:  [phone]    Agent: Please be advised that RX refills may take up to 3 business days. We ask that you follow-up with your pharmacy.

## 2021-02-02 DIAGNOSIS — H2511 Age-related nuclear cataract, right eye: Secondary | ICD-10-CM | POA: Diagnosis not present

## 2021-03-15 DIAGNOSIS — R070 Pain in throat: Secondary | ICD-10-CM | POA: Diagnosis not present

## 2021-03-15 DIAGNOSIS — J069 Acute upper respiratory infection, unspecified: Secondary | ICD-10-CM | POA: Diagnosis not present

## 2021-03-20 ENCOUNTER — Other Ambulatory Visit: Payer: Self-pay

## 2021-03-20 ENCOUNTER — Ambulatory Visit (INDEPENDENT_AMBULATORY_CARE_PROVIDER_SITE_OTHER): Payer: Medicare HMO

## 2021-03-20 VITALS — BP 126/82 | HR 83 | Temp 98.2°F | Resp 16 | Ht 63.0 in | Wt 157.6 lb

## 2021-03-20 DIAGNOSIS — Z0000 Encounter for general adult medical examination without abnormal findings: Secondary | ICD-10-CM | POA: Diagnosis not present

## 2021-03-20 NOTE — Progress Notes (Signed)
Subjective:   Kristin Escobar is a 70 y.o. female who presents for Medicare Annual (Subsequent) preventive examination.  Review of Systems     Cardiac Risk Factors include: advanced age (>55men, >61 women)     Objective:    Today's Vitals   03/20/21 1406  BP: 126/82  Pulse: 83  Resp: 16  Temp: 98.2 F (36.8 C)  TempSrc: Oral  SpO2: 97%  Weight: 157 lb 9.6 oz (71.5 kg)  Height: 5\' 3"  (1.6 m)   Body mass index is 27.92 kg/m.  Advanced Directives 03/20/2021 03/16/2020 12/17/2018 08/07/2017 07/24/2017 07/17/2017  Does Patient Have a Medical Advance Directive? No No No No No No  Would patient like information on creating a medical advance directive? Yes (MAU/Ambulatory/Procedural Areas - Information given) Yes (MAU/Ambulatory/Procedural Areas - Information given) No - Patient declined No - Patient declined No - Patient declined Yes (MAU/Ambulatory/Procedural Areas - Information given)    Current Medications (verified) Outpatient Encounter Medications as of 03/20/2021  Medication Sig   ipratropium (ATROVENT) 0.06 % nasal spray Place 2 sprays into both nostrils 2 (two) times daily.   SYNTHROID 88 MCG tablet Take 1 tablet (88 mcg total) by mouth daily before breakfast.   amoxicillin-clavulanate (AUGMENTIN) 875-125 MG tablet Take 1 tablet by mouth 2 (two) times daily. (Patient not taking: Reported on 03/20/2021)   cholestyramine (QUESTRAN) 4 g packet Take by mouth.   [DISCONTINUED] brimonidine (ALPHAGAN) 0.2 % ophthalmic solution Place 1 drop into the left eye 3 (three) times daily.   No facility-administered encounter medications on file as of 03/20/2021.    Allergies (verified) Patient has no known allergies.   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)    Vertigo    no episodes in over 10 yrs   Past Surgical History:   Procedure Laterality Date   COLONOSCOPY WITH PROPOFOL N/A 07/17/2017   Procedure: COLONOSCOPY WITH PROPOFOL;  Surgeon: Lin Landsman, MD;  Location: [place];  Service: Endoscopy;  Laterality: N/A;   COLONOSCOPY WITH PROPOFOL N/A 12/17/2018   Procedure: COLONOSCOPY WITH PROPOFOL;  Surgeon: Lin Landsman, MD;  Location: [place];  Service: Endoscopy;  Laterality: N/A;   LAPAROSCOPIC ILEOCECECTOMY  09/2017   neuroendocrine tumor   POLYPECTOMY  07/17/2017   Procedure: POLYPECTOMY;  Surgeon: Lin Landsman, MD;  Location: [place];  Service: Endoscopy;;   POLYPECTOMY  12/17/2018   Procedure: POLYPECTOMY;  Surgeon: Lin Landsman, MD;  Location: [place];  Service: Endoscopy;;   TOTAL THYROIDECTOMY  08/2017   no carcinoma   Family History  Problem Relation Age of Onset   Stroke Mother    Colon cancer Father    Prostate cancer Father    Breast cancer Maternal Aunt 70   Liver cancer Maternal Aunt    Stomach cancer Maternal Aunt    Social History   Socioeconomic History   Marital status: Single    Spouse name: Not on file   Number of children: 1   Years of education: Not on file   Highest education level: Not on file  Occupational History   Occupation: Retired   Tobacco Use   Smoking status: Some Days    Packs/day: 0.25    Years: 15.00    Pack years: 3.75    Types: Cigarettes    Last attempt to quit: 08/09/2017  Years since quitting: 3.6   Smokeless tobacco: Never  Vaping Use   Vaping Use: Never used  Substance and Sexual Activity   Alcohol use: Yes    Comment: 1-2 drinks/month   Drug use: No   Sexual activity: Never  Other Topics Concern   Not on file  Social History Narrative   Pt lives alone   Social Determinants of Health   Financial Resource Strain: Low Risk    Difficulty of Paying Living Expenses: Not very hard  Food Insecurity: No Food Insecurity   Worried About Charity fundraiser in the Last  Year: Never true   Ran Out of Food in the Last Year: Never true  Transportation Needs: No Transportation Needs   Lack of Transportation (Medical): No   Lack of Transportation (Non-Medical): No  Physical Activity: Inactive   Days of Exercise per Week: 0 days   Minutes of Exercise per Session: 0 min  Stress: No Stress Concern Present   Feeling of Stress : Not at all  Social Connections: Socially Isolated   Frequency of Communication with Friends and Family: More than three times a week   Frequency of Social Gatherings with Friends and Family: More than three times a week   Attends Religious Services: Never   Marine scientist or Organizations: No   Attends Music therapist: Never   Marital Status: Never married    Tobacco Counseling Ready to quit: Yes Counseling given: Yes   Clinical Intake:  Pre-visit preparation completed: Yes  Pain : No/denies pain     BMI - recorded: 27.92 Nutritional Status: BMI 25 -29 Overweight Nutritional Risks: None Diabetes: No  How often do you need to have someone help you when you read instructions, pamphlets, or other written materials from your doctor or pharmacy?: 1 - Never    Interpreter Needed?: No  Information entered by :: Clemetine Marker LPN   Activities of Daily Living In your present state of health, do you have any difficulty performing the following activities: 03/20/2021 10/12/2020  Hearing? N N  Vision? N Y  Difficulty concentrating or making decisions? N N  Walking or climbing stairs? N N  Dressing or bathing? N N  Doing errands, shopping? N N  Preparing Food and eating ? N -  Using the Toilet? N -  In the past six months, have you accidently leaked urine? N -  Do you have problems with loss of bowel control? N -  Managing your Medications? N -  Managing your Finances? N -  Housekeeping or managing your Housekeeping? N -  Some recent data might be hidden    Patient Care Team: Glean Hess, MD  as PCP - General (Internal Medicine) Lin Landsman, MD as Consulting Physician (Gastroenterology) Gae Dry, MD as Referring Physician (Obstetrics and Gynecology)  Indicate any recent Medical Services you may have received from other than [facility] providers in the past year (date may be approximate).     Assessment:   This is a routine wellness examination for Kristin Escobar.  Hearing/Vision screen Hearing Screening - Comments:: Pt denies hearing difficulty Vision Screening - Comments:: Annual vision screenings done at [place] issues and exercise activities discussed: Current Exercise Habits: The patient does not participate in regular exercise at present, Exercise limited by: None identified   Goals Addressed             This Visit's Progress    Increase physical activity  Not on track    Recommend increasing physical activity to at least 3 days per week       Depression Screen PHQ 2/9 Scores 03/20/2021 10/12/2020 03/16/2020 10/08/2019 03/03/2019 10/07/2018 01/24/2018  PHQ - 2 Score 2 1 0 0 0 0 0  PHQ- 9 Score 6 6 - 0 - - 3    Fall Risk Fall Risk  03/20/2021 10/12/2020 03/16/2020 10/08/2019 10/07/2018  Falls in the past year? 0 0 0 0 0  Number falls in past yr: 0 - 0 0 0  Injury with Fall? 0 - 0 0 0  Risk for fall due to : No Fall Risks - No Fall Risks No Fall Risks -  Follow up Falls prevention discussed Falls evaluation completed Falls prevention discussed Falls evaluation completed Falls evaluation completed    [place]:  Any stairs in or around the home? No  If so, are there any without handrails? No  Home free of loose throw rugs in walkways, pet beds, electrical cords, etc? Yes  Adequate lighting in your home to reduce risk of falls? Yes   ASSISTIVE DEVICES UTILIZED TO PREVENT FALLS:  Life alert? No  Use of a cane, walker or w/c? No  Grab bars in the bathroom? No  Shower chair or bench in shower? No   Elevated toilet seat or a handicapped toilet? No   TIMED UP AND GO:  Was the test performed? Yes .  Length of time to ambulate 10 feet: 5 sec.   Gait steady and fast without use of assistive device  Cognitive Function: Normal cognitive status assessed by direct observation by this Nurse Health Advisor. No abnormalities found.       6CIT Screen 03/16/2020  What Year? 0 points  What month? 0 points  What time? 0 points  Count back from 20 0 points  Months in reverse 0 points  Repeat phrase 0 points  Total Score 0    Immunizations Immunization History  Administered Date(s) Administered   Influenza, High Dose Seasonal PF 04/02/2018, 02/10/2019, 01/31/2020, 02/28/2021   PFIZER(Purple Top)SARS-COV-2 Vaccination 07/22/2019, 08/12/2019, 02/22/2020, 11/02/2020   Pfizer Covid-19 Vaccine Bivalent Booster 97yrs & up 02/28/2021   Pneumococcal Conjugate-13 06/14/2017, 04/02/2018   Pneumococcal Polysaccharide-23 10/07/2018, 01/31/2020   Zoster Recombinat (Shingrix) 03/13/2018, 02/10/2019    TDAP status: Due, Education has been provided regarding the importance of this vaccine. Advised may receive this vaccine at local pharmacy or Health Dept. Aware to provide a copy of the vaccination record if obtained from local pharmacy or Health Dept. Verbalized acceptance and understanding.  Flu Vaccine status: Up to date  Pneumococcal vaccine status: Up to date  Covid-19 vaccine status: Completed vaccines  Qualifies for Shingles Vaccine? Yes   Zostavax completed No   Shingrix Completed?: Yes  Screening Tests Health Maintenance  Topic Date Due   TETANUS/TDAP  Never done   MAMMOGRAM  12/01/2021   COLONOSCOPY (Pts 45-2yrs Insurance coverage will need to be confirmed)  12/16/2021   Pneumonia Vaccine 12+ Years old  Completed   INFLUENZA VACCINE  Completed   DEXA SCAN  Completed   COVID-19 Vaccine  Completed   Hepatitis C Screening  Completed   Zoster Vaccines- Shingrix  Completed   HPV  VACCINES  Aged Out    Health Maintenance  Health Maintenance Due  Topic Date Due   TETANUS/TDAP  Never done    Colorectal cancer screening: Type of screening: Colonoscopy. Completed 12/17/18. Repeat every 3 years  Mammogram status: Completed 12/01/20. Repeat every year  Bone Density status: Completed 12/01/20. Results reflect: Bone density results: OSTEOPENIA. Repeat every 2 years.  Lung Cancer Screening: (Low Dose CT Chest recommended if Age 27-80 years, 30 pack-year currently smoking OR have quit w/in 15years.) does not qualify.   Additional Screening:  Hepatitis C Screening: does qualify; Completed 10/08/19  Vision Screening: Recommended annual ophthalmology exams for early detection of glaucoma and other disorders of the eye. Is the patient up to date with their annual eye exam?  Yes  Who is the provider or what is the name of the office in which the patient attends annual eye exams? [facility].   Dental Screening: Recommended annual dental exams for proper oral hygiene  Community Resource Referral / Chronic Care Management: CRR required this visit?  No   CCM required this visit?  No      Plan:     I have personally reviewed and noted the following in the patient's chart:   Medical and social history Use of alcohol, tobacco or illicit drugs  Current medications and supplements including opioid prescriptions.  Functional ability and status Nutritional status Physical activity Advanced directives List of other physicians Hospitalizations, surgeries, and ER visits in previous 12 months Vitals Screenings to include cognitive, depression, and falls Referrals and appointments  In addition, I have reviewed and discussed with patient certain preventive protocols, quality metrics, and best practice recommendations. A written personalized care plan for preventive services as well as general preventive health recommendations were provided to patient.     Clemetine Marker,  LPN   [id number]   Nurse Notes: pt seen by [facility] on 03/15/21 for acute URI. Negative for strep, covid and flu. Pt c/o fatigue and clear mucous; was prescribed augmentin but decided not to take it.

## 2021-03-20 NOTE — Patient Instructions (Signed)
Kristin Escobar , Thank you for taking time to come for your Medicare Wellness Visit. I appreciate your ongoing commitment to your health goals. Please review the following plan we discussed and let me know if I can assist you in the future.   Screening recommendations/referrals: Colonoscopy: done 12/17/18. Repeat 11/2021 Mammogram: done 12/01/20 Bone Density: done 12/01/20 Recommended yearly ophthalmology/optometry visit for glaucoma screening and checkup Recommended yearly dental visit for hygiene and checkup  Vaccinations: Influenza vaccine: done 02/28/21 Pneumococcal vaccine: done 01/31/20 Tdap vaccine: due Shingles vaccine: done 03/13/18 & 02/10/19   Covid-19: done 07/22/19, 08/12/19, 02/22/20, 11/02/20 & 02/28/21  Advanced directives: Advance directive discussed with you today. I have provided a copy for you to complete at home and have notarized. Once this is complete please bring a copy in to our office so we can scan it into your chart.   Conditions/risks identified: If you wish to quit smoking, help is available. For free tobacco cessation program offerings call the [facility] at (332)540-6138 or Live Well Line at 508-235-7185. You may also visit [email or web address] or email [email or web address] for more information on other programs.    Next appointment: Follow up in one year for your annual wellness visit    Preventive Care 65 Years and Older, Female Preventive care refers to lifestyle choices and visits with your health care provider that can promote health and wellness. What does preventive care include? A yearly physical exam. This is also called an annual well check. Dental exams once or twice a year. Routine eye exams. Ask your health care provider how often you should have your eyes checked. Personal lifestyle choices, including: Daily care of your teeth and gums. Regular physical activity. Eating a healthy diet. Avoiding tobacco and drug use. Limiting  alcohol use. Practicing safe sex. Taking low-dose aspirin every day. Taking vitamin and mineral supplements as recommended by your health care provider. What happens during an annual well check? The services and screenings done by your health care provider during your annual well check will depend on your age, overall health, lifestyle risk factors, and family history of disease. Counseling  Your health care provider may ask you questions about your: Alcohol use. Tobacco use. Drug use. Emotional well-being. Home and relationship well-being. Sexual activity. Eating habits. History of falls. Memory and ability to understand (cognition). Work and work Statistician. Reproductive health. Screening  You may have the following tests or measurements: Height, weight, and BMI. Blood pressure. Lipid and cholesterol levels. These may be checked every 5 years, or more frequently if you are over 86 years old. Skin check. Lung cancer screening. You may have this screening every year starting at age 8 if you have a 30-pack-year history of smoking and currently smoke or have quit within the past 15 years. Fecal occult blood test (FOBT) of the stool. You may have this test every year starting at age 31. Flexible sigmoidoscopy or colonoscopy. You may have a sigmoidoscopy every 5 years or a colonoscopy every 10 years starting at age 25. Hepatitis C blood test. Hepatitis B blood test. Sexually transmitted disease (STD) testing. Diabetes screening. This is done by checking your blood sugar (glucose) after you have not eaten for a while (fasting). You may have this done every 1-3 years. Bone density scan. This is done to screen for osteoporosis. You may have this done starting at age 17. Mammogram. This may be done every 1-2 years. Talk to your health care provider about how often you should  have regular mammograms. Talk with your health care provider about your test results, treatment options, and if  necessary, the need for more tests. Vaccines  Your health care provider may recommend certain vaccines, such as: Influenza vaccine. This is recommended every year. Tetanus, diphtheria, and acellular pertussis (Tdap, Td) vaccine. You may need a Td booster every 10 years. Zoster vaccine. You may need this after age 53. Pneumococcal 13-valent conjugate (PCV13) vaccine. One dose is recommended after age 54. Pneumococcal polysaccharide (PPSV23) vaccine. One dose is recommended after age 97. Talk to your health care provider about which screenings and vaccines you need and how often you need them. This information is not intended to replace advice given to you by your health care provider. Make sure you discuss any questions you have with your health care provider. Document Released: 06/10/2015 Document Revised: 02/01/2016 Document Reviewed: 03/15/2015 Elsevier Interactive Patient Education  2017 [place] Prevention in the Home Falls can cause injuries. They can happen to people of all ages. There are many things you can do to make your home safe and to help prevent falls. What can I do on the outside of my home? Regularly fix the edges of walkways and driveways and fix any cracks. Remove anything that might make you trip as you walk through a door, such as a raised step or threshold. Trim any bushes or trees on the path to your home. Use bright outdoor lighting. Clear any walking paths of anything that might make someone trip, such as rocks or tools. Regularly check to see if handrails are loose or broken. Make sure that both sides of any steps have handrails. Any raised decks and porches should have guardrails on the edges. Have any leaves, snow, or ice cleared regularly. Use sand or salt on walking paths during winter. Clean up any spills in your garage right away. This includes oil or grease spills. What can I do in the bathroom? Use night lights. Install grab bars by the toilet  and in the tub and shower. Do not use towel bars as grab bars. Use non-skid mats or decals in the tub or shower. If you need to sit down in the shower, use a plastic, non-slip stool. Keep the floor dry. Clean up any water that spills on the floor as soon as it happens. Remove soap buildup in the tub or shower regularly. Attach bath mats securely with double-sided non-slip rug tape. Do not have throw rugs and other things on the floor that can make you trip. What can I do in the bedroom? Use night lights. Make sure that you have a light by your bed that is easy to reach. Do not use any sheets or blankets that are too big for your bed. They should not hang down onto the floor. Have a firm chair that has side arms. You can use this for support while you get dressed. Do not have throw rugs and other things on the floor that can make you trip. What can I do in the kitchen? Clean up any spills right away. Avoid walking on wet floors. Keep items that you use a lot in easy-to-reach places. If you need to reach something above you, use a strong step stool that has a grab bar. Keep electrical cords out of the way. Do not use floor polish or wax that makes floors slippery. If you must use wax, use non-skid floor wax. Do not have throw rugs and other things on the floor  that can make you trip. What can I do with my stairs? Do not leave any items on the stairs. Make sure that there are handrails on both sides of the stairs and use them. Fix handrails that are broken or loose. Make sure that handrails are as long as the stairways. Check any carpeting to make sure that it is firmly attached to the stairs. Fix any carpet that is loose or worn. Avoid having throw rugs at the top or bottom of the stairs. If you do have throw rugs, attach them to the floor with carpet tape. Make sure that you have a light switch at the top of the stairs and the bottom of the stairs. If you do not have them, ask someone to add  them for you. What else can I do to help prevent falls? Wear shoes that: Do not have high heels. Have rubber bottoms. Are comfortable and fit you well. Are closed at the toe. Do not wear sandals. If you use a stepladder: Make sure that it is fully opened. Do not climb a closed stepladder. Make sure that both sides of the stepladder are locked into place. Ask someone to hold it for you, if possible. Clearly mark and make sure that you can see: Any grab bars or handrails. First and last steps. Where the edge of each step is. Use tools that help you move around (mobility aids) if they are needed. These include: Canes. Walkers. Scooters. Crutches. Turn on the lights when you go into a dark area. Replace any light bulbs as soon as they burn out. Set up your furniture so you have a clear path. Avoid moving your furniture around. If any of your floors are uneven, fix them. If there are any pets around you, be aware of where they are. Review your medicines with your doctor. Some medicines can make you feel dizzy. This can increase your chance of falling. Ask your doctor what other things that you can do to help prevent falls. This information is not intended to replace advice given to you by your health care provider. Make sure you discuss any questions you have with your health care provider. Document Released: 03/10/2009 Document Revised: 10/20/2015 Document Reviewed: 06/18/2014 Elsevier Interactive Patient Education  2017 [employer].

## 2021-04-17 ENCOUNTER — Encounter: Payer: Self-pay | Admitting: Internal Medicine

## 2021-04-17 ENCOUNTER — Other Ambulatory Visit: Payer: Self-pay

## 2021-04-17 ENCOUNTER — Ambulatory Visit (INDEPENDENT_AMBULATORY_CARE_PROVIDER_SITE_OTHER): Payer: Medicare HMO | Admitting: Internal Medicine

## 2021-04-17 VITALS — BP 122/68 | HR 93 | Temp 98.2°F | Ht 63.0 in | Wt 158.0 lb

## 2021-04-17 DIAGNOSIS — E890 Postprocedural hypothyroidism: Secondary | ICD-10-CM

## 2021-04-17 DIAGNOSIS — R1030 Lower abdominal pain, unspecified: Secondary | ICD-10-CM | POA: Diagnosis not present

## 2021-04-17 LAB — POC URINALYSIS WITH MICROSCOPIC (NON AUTO)MANUAL RESULT
Crystals: 0
Glucose, UA: NEGATIVE
Ketones, UA: NEGATIVE
Mucus, UA: 0
Nitrite, UA: NEGATIVE
Protein, UA: POSITIVE — AB
RBC: 5 M/uL (ref 4.04–5.48)
Spec Grav, UA: 1.015 (ref 1.010–1.025)
Urobilinogen, UA: 0.2 E.U./dL
WBC Casts, UA: 10
pH, UA: 6 (ref 5.0–8.0)

## 2021-04-17 MED ORDER — CEFUROXIME AXETIL 250 MG PO TABS: 250.0000 mg | ORAL_TABLET | Freq: Two times a day (BID) | ORAL | 0 refills | Status: AC

## 2021-04-17 NOTE — Progress Notes (Signed)
Date:  04/17/2021   Name:  Kristin Escobar   DOB:  [birth year]   MRN:  [id number]   Chief Complaint: Hypothyroidism  Thyroid Problem Presents for follow-up visit. Patient reports no anxiety, diarrhea or fatigue. The symptoms have been stable.  Dysuria  This is a new problem. The current episode started in the past 7 days. The problem has been waxing and waning. The quality of the pain is described as aching and burning. The pain is mild. The maximum temperature recorded prior to her arrival was 100 - 100.9 F. The fever has been present for Less than 1 day. Associated symptoms include frequency and urgency. Pertinent negatives include no chills, nausea or vomiting.   Lab Results  Component Value Date   CREATININE 0.90 10/12/2020   BUN 12 10/12/2020   NA 139 10/12/2020   K 4.1 10/12/2020   CL 101 10/12/2020   CO2 25 10/12/2020   Lab Results  Component Value Date   CHOL 196 10/12/2020   HDL 56 10/12/2020   LDLCALC 116 (H) 10/12/2020   TRIG 135 10/12/2020   CHOLHDL 3.5 10/12/2020   Lab Results  Component Value Date   TSH 10.200 (H) 10/12/2020   Lab Results  Component Value Date   HGBA1C 5.6 10/12/2020   Lab Results  Component Value Date   WBC 4.6 10/12/2020   HGB 13.7 10/12/2020   HCT 41.0 10/12/2020   MCV 95 10/12/2020   PLT 210 10/12/2020   Lab Results  Component Value Date   ALT 22 10/12/2020   AST 21 10/12/2020   ALKPHOS 93 10/12/2020   BILITOT 0.6 10/12/2020   No components found for: VITD  Review of Systems  Constitutional:  Negative for chills, fatigue, fever and unexpected weight change.  Respiratory:  Negative for chest tightness and shortness of breath.   Cardiovascular:  Negative for chest pain and leg swelling.  Gastrointestinal:  Positive for abdominal pain. Negative for diarrhea, nausea and vomiting.  Genitourinary:  Positive for dysuria, frequency and urgency.  Psychiatric/Behavioral:  Negative for dysphoric mood. The patient is not  nervous/anxious.    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   Nocturnal leg cramps 06/14/2017    No Known Allergies  Past Surgical History:  Procedure Laterality Date   COLONOSCOPY WITH PROPOFOL N/A 07/17/2017   Procedure: COLONOSCOPY WITH PROPOFOL;  Surgeon: Lin Landsman, MD;  Location: [facility];  Service: Endoscopy;  Laterality: N/A;   COLONOSCOPY WITH PROPOFOL N/A 12/17/2018   Procedure: COLONOSCOPY WITH PROPOFOL;  Surgeon: Lin Landsman, MD;  Location: [facility];  Service: Endoscopy;  Laterality: N/A;   LAPAROSCOPIC ILEOCECECTOMY  09/2017   neuroendocrine tumor   POLYPECTOMY  07/17/2017   Procedure: POLYPECTOMY;  Surgeon: Lin Landsman, MD;  Location: [facility];  Service: Endoscopy;;   POLYPECTOMY  12/17/2018   Procedure: POLYPECTOMY;  Surgeon: Lin Landsman, MD;  Location: [facility];  Service: Endoscopy;;   TOTAL THYROIDECTOMY  08/2017   no carcinoma    Social History   Tobacco Use   Smoking status: Some  Days    Packs/day: 0.25    Years: 15.00    Pack years: 3.75    Types: Cigarettes    Last attempt to quit: 08/09/2017    Years since quitting: 3.6   Smokeless tobacco: Never  Vaping Use   Vaping Use: Never used  Substance Use Topics   Alcohol use: Yes    Comment: 1-2 drinks/month   Drug use: No     Medication list has been reviewed and updated.  Current Meds  Medication Sig   Biotin 1 MG CAPS Take by mouth.   calcium acetate, Phos Binder, (PHOSLYRA) 667 MG/5ML SOLN Take by  mouth 3 (three) times daily with meals.   Cholecalciferol (VITAMIN D3) 1.25 MG (50000 UT) CAPS Take by mouth.   SYNTHROID 88 MCG tablet Take 1 tablet (88 mcg total) by mouth daily before breakfast.   [DISCONTINUED] cholestyramine (QUESTRAN) 4 g packet Take by mouth.   [DISCONTINUED] ipratropium (ATROVENT) 0.06 % nasal spray Place 2 sprays into both nostrils 2 (two) times daily.    PHQ 2/9 Scores 04/17/2021 03/20/2021 10/12/2020 03/16/2020  PHQ - 2 Score 0 2 1 0  PHQ- 9 Score 0 6 6 -    GAD 7 : Generalized Anxiety Score 04/17/2021 04/17/2021 10/12/2020 10/08/2019  Nervous, Anxious, on Edge 0 0 0 0  Control/stop worrying 3 0 1 0  Worry too much - different things 3 0 1 0  Trouble relaxing 0 0 0 0  Restless 0 0 0 0  Easily annoyed or irritable 0 0 0 0  Afraid - awful might happen 0 0 0 0  Total GAD 7 Score 6 0 2 0  Anxiety Difficulty Not difficult at all Not difficult at all - Not difficult at all    BP Readings from Last 3 Encounters:  04/17/21 122/68  03/20/21 126/82  10/12/20 120/70    Physical Exam Vitals and nursing note reviewed.  Constitutional:      Appearance: She is well-developed.  Neck:     Vascular: No carotid bruit.  Cardiovascular:     Rate and Rhythm: Normal rate and regular rhythm.     Heart sounds: Normal heart sounds.  Pulmonary:     Effort: Pulmonary effort is normal. No respiratory distress.     Breath sounds: Normal breath sounds.  Abdominal:     General: Bowel sounds are normal.     Palpations: Abdomen is soft.     Tenderness: There is abdominal tenderness in the suprapubic area. There is no right CVA tenderness, left CVA tenderness, guarding or rebound.  Musculoskeletal:     Cervical back: Normal range of motion. No tenderness.     Right lower leg: No edema.     Left lower leg: No edema.  Lymphadenopathy:     Cervical: No cervical adenopathy.    Wt Readings from Last 3 Encounters:  04/17/21 158 lb (71.7 kg)  03/20/21 157 lb 9.6 oz (71.5 kg)   10/12/20 159 lb 3.2 oz (72.2 kg)    BP 122/68   Pulse 93   Temp 98.2 F (36.8 C) (Oral)   Ht 5\' 3"  (1.6 m)   Wt 158 lb (71.7 kg)   SpO2 96%   BMI 27.99 kg/m   Assessment and Plan: 1. Postoperative hypothyroidism Last TSH was elevated with normal T4 Recheck labs and adjust dose if needed - TSH + free T4  2. Lower abdominal pain C/w UTI - continue to push fluids Can use AZO for 2-3 days if  needed Follow up if no improvement - CBC with Differential/Platelet - POC urinalysis w microscopic (non auto) - cefUROXime (CEFTIN) 250 MG tablet; Take 1 tablet (250 mg total) by mouth 2 (two) times daily with a meal for 7 days.  Dispense: 14 tablet; Refill: 0   Partially dictated using Editor, commissioning. Any errors are unintentional.  Halina Maidens, MD [facility]  04/17/2021

## 2021-04-18 LAB — CBC WITH DIFFERENTIAL/PLATELET
Basophils Absolute: 0 10*3/uL (ref 0.0–0.2)
Basos: 0 %
EOS (ABSOLUTE): 0.1 10*3/uL (ref 0.0–0.4)
Eos: 1 %
Hematocrit: 38.7 % (ref 34.0–46.6)
Hemoglobin: 13.4 g/dL (ref 11.1–15.9)
Immature Grans (Abs): 0 10*3/uL (ref 0.0–0.1)
Immature Granulocytes: 0 %
Lymphocytes Absolute: 1.5 10*3/uL (ref 0.7–3.1)
Lymphs: 14 %
MCH: 32.8 pg (ref 26.6–33.0)
MCHC: 34.6 g/dL (ref 31.5–35.7)
MCV: 95 fL (ref 79–97)
Monocytes Absolute: 1 10*3/uL — ABNORMAL HIGH (ref 0.1–0.9)
Monocytes: 10 %
Neutrophils Absolute: 7.7 10*3/uL — ABNORMAL HIGH (ref 1.4–7.0)
Neutrophils: 75 %
Platelets: 249 10*3/uL (ref 150–450)
RBC: 4.08 x10E6/uL (ref 3.77–5.28)
RDW: 12.3 % (ref 11.7–15.4)
WBC: 10.3 10*3/uL (ref 3.4–10.8)

## 2021-04-18 LAB — TSH+FREE T4
Free T4: 1.43 ng/dL (ref 0.82–1.77)
TSH: 6.77 u[IU]/mL — ABNORMAL HIGH (ref 0.450–4.500)

## 2021-04-21 DIAGNOSIS — E785 Hyperlipidemia, unspecified: Secondary | ICD-10-CM | POA: Diagnosis not present

## 2021-04-21 DIAGNOSIS — E890 Postprocedural hypothyroidism: Secondary | ICD-10-CM | POA: Diagnosis not present

## 2021-04-21 DIAGNOSIS — K5720 Diverticulitis of large intestine with perforation and abscess without bleeding: Secondary | ICD-10-CM | POA: Diagnosis not present

## 2021-04-21 DIAGNOSIS — R1030 Lower abdominal pain, unspecified: Secondary | ICD-10-CM | POA: Diagnosis not present

## 2021-04-21 DIAGNOSIS — K651 Peritoneal abscess: Secondary | ICD-10-CM | POA: Diagnosis not present

## 2021-04-21 DIAGNOSIS — L02211 Cutaneous abscess of abdominal wall: Secondary | ICD-10-CM | POA: Diagnosis not present

## 2021-04-21 DIAGNOSIS — Z9889 Other specified postprocedural states: Secondary | ICD-10-CM | POA: Diagnosis not present

## 2021-04-21 DIAGNOSIS — Z800 Family history of malignant neoplasm of digestive organs: Secondary | ICD-10-CM | POA: Diagnosis not present

## 2021-04-21 DIAGNOSIS — R300 Dysuria: Secondary | ICD-10-CM | POA: Diagnosis not present

## 2021-04-21 DIAGNOSIS — K5732 Diverticulitis of large intestine without perforation or abscess without bleeding: Secondary | ICD-10-CM | POA: Diagnosis not present

## 2021-04-21 DIAGNOSIS — K5780 Diverticulitis of intestine, part unspecified, with perforation and abscess without bleeding: Secondary | ICD-10-CM | POA: Diagnosis not present

## 2021-04-21 DIAGNOSIS — Z20822 Contact with and (suspected) exposure to covid-19: Secondary | ICD-10-CM | POA: Diagnosis not present

## 2021-04-21 DIAGNOSIS — R1110 Vomiting, unspecified: Secondary | ICD-10-CM | POA: Diagnosis not present

## 2021-04-21 DIAGNOSIS — Z9049 Acquired absence of other specified parts of digestive tract: Secondary | ICD-10-CM | POA: Diagnosis not present

## 2021-04-21 DIAGNOSIS — R509 Fever, unspecified: Secondary | ICD-10-CM | POA: Diagnosis not present

## 2021-04-21 DIAGNOSIS — Z6829 Body mass index (BMI) 29.0-29.9, adult: Secondary | ICD-10-CM | POA: Diagnosis not present

## 2021-04-21 DIAGNOSIS — R319 Hematuria, unspecified: Secondary | ICD-10-CM | POA: Diagnosis not present

## 2021-04-21 DIAGNOSIS — Z803 Family history of malignant neoplasm of breast: Secondary | ICD-10-CM | POA: Diagnosis not present

## 2021-04-21 DIAGNOSIS — Z85060 Personal history of malignant carcinoid tumor of small intestine: Secondary | ICD-10-CM | POA: Diagnosis not present

## 2021-04-21 DIAGNOSIS — Z87891 Personal history of nicotine dependence: Secondary | ICD-10-CM | POA: Diagnosis not present

## 2021-04-21 DIAGNOSIS — R109 Unspecified abdominal pain: Secondary | ICD-10-CM | POA: Diagnosis not present

## 2021-04-24 ENCOUNTER — Telehealth: Payer: Self-pay

## 2021-04-24 NOTE — Telephone Encounter (Signed)
Copied from [place] [id number]. Topic: General - Inquiry >> Apr 24, 2021  4:02 PM Oneta Rack wrote: Patient would like PCP to nurse to call her back "ASAP" patient did not want to elaborate.

## 2021-04-24 NOTE — Telephone Encounter (Signed)
Called and spoke with pt. She said she was admitted in the hospital and has diverticulitis with perforation. She wanted [place] to be aware we diagnosed her with the wrong diagnosis. Informed Dr Army Melia and apologized to patient. Informed her that abdominal pain can sometimes be many different things and its sometimes complicated to diagnose. She verbalized understanding and thanked her for her call.

## 2021-04-26 DIAGNOSIS — K5720 Diverticulitis of large intestine with perforation and abscess without bleeding: Secondary | ICD-10-CM | POA: Diagnosis not present

## 2021-05-10 DIAGNOSIS — K5720 Diverticulitis of large intestine with perforation and abscess without bleeding: Secondary | ICD-10-CM | POA: Diagnosis not present

## 2021-05-10 DIAGNOSIS — Z6827 Body mass index (BMI) 27.0-27.9, adult: Secondary | ICD-10-CM | POA: Diagnosis not present

## 2021-07-26 DIAGNOSIS — Z961 Presence of intraocular lens: Secondary | ICD-10-CM | POA: Diagnosis not present

## 2021-07-26 DIAGNOSIS — H43812 Vitreous degeneration, left eye: Secondary | ICD-10-CM | POA: Diagnosis not present

## 2021-07-26 DIAGNOSIS — H04123 Dry eye syndrome of bilateral lacrimal glands: Secondary | ICD-10-CM | POA: Diagnosis not present

## 2021-07-28 DIAGNOSIS — J069 Acute upper respiratory infection, unspecified: Secondary | ICD-10-CM | POA: Diagnosis not present

## 2021-07-28 DIAGNOSIS — R059 Cough, unspecified: Secondary | ICD-10-CM | POA: Diagnosis not present

## 2021-07-28 DIAGNOSIS — J029 Acute pharyngitis, unspecified: Secondary | ICD-10-CM | POA: Diagnosis not present

## 2021-08-07 ENCOUNTER — Ambulatory Visit (INDEPENDENT_AMBULATORY_CARE_PROVIDER_SITE_OTHER): Payer: Medicare HMO | Admitting: Obstetrics & Gynecology

## 2021-08-07 ENCOUNTER — Encounter: Payer: Self-pay | Admitting: Obstetrics & Gynecology

## 2021-08-07 ENCOUNTER — Other Ambulatory Visit: Payer: Self-pay

## 2021-08-07 ENCOUNTER — Other Ambulatory Visit (HOSPITAL_COMMUNITY)
Admission: RE | Admit: 2021-08-07 | Discharge: 2021-08-07 | Disposition: A | Discharge status: Home / Self Care | Payer: Medicare HMO | Source: Ambulatory Visit | Attending: Obstetrics & Gynecology | Admitting: Obstetrics & Gynecology

## 2021-08-07 VITALS — BP 120/80 | Ht 62.5 in | Wt 158.0 lb

## 2021-08-07 DIAGNOSIS — Z01419 Encounter for gynecological examination (general) (routine) without abnormal findings: Secondary | ICD-10-CM

## 2021-08-07 DIAGNOSIS — Z124 Encounter for screening for malignant neoplasm of cervix: Secondary | ICD-10-CM | POA: Insufficient documentation

## 2021-08-07 DIAGNOSIS — R69 Illness, unspecified: Secondary | ICD-10-CM | POA: Diagnosis not present

## 2021-08-07 DIAGNOSIS — Z1151 Encounter for screening for human papillomavirus (HPV): Secondary | ICD-10-CM | POA: Diagnosis not present

## 2021-08-07 NOTE — Progress Notes (Signed)
?HPI: ?     [name] is a 71 y.o. G1P1001 who LMP was in the past, she presents today for her annual examination.  The patient has no complaints today. The patient is not currently sexually active. Herlast pap: approximate date 2018 and was normal and last mammogram: approximate date 11/2020 and was normal.  The patient does perform self breast exams.  There is no notable family history of breast or ovarian cancer in her family. The patient is not taking hormone replacement therapy. Patient denies post-menopausal vaginal bleeding.   The patient has regular exercise: yes. The patient denies current symptoms of depression.   ? ?GYN Hx: ?Last Colonoscopy: pt adnits she is due    ?Last DEXA: 2 years ago.   ? ?PMHx: ?Past Medical History:  ?Diagnosis Date  ? Former smoker   ? 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 Vertigo   ? no episodes in over 10 yrs  ? ?Past Surgical History:  ?Procedure Laterality Date  ? COLONOSCOPY WITH PROPOFOL N/A 07/17/2017  ? Procedure: COLONOSCOPY WITH PROPOFOL;  Surgeon: Lin Landsman, MD;  Location: [facility];  Service: Endoscopy;  Laterality: N/A;  ? COLONOSCOPY WITH PROPOFOL N/A 12/17/2018  ? Procedure: COLONOSCOPY WITH PROPOFOL;  Surgeon: Lin Landsman, MD;  Location: [facility];  Service: Endoscopy;  Laterality: N/A;  ? LAPAROSCOPIC ILEOCECECTOMY  09/2017  ? neuroendocrine tumor  ? POLYPECTOMY  07/17/2017  ? Procedure: POLYPECTOMY;  Surgeon: Lin Landsman, MD;  Location: [facility];  Service: Endoscopy;;  ? POLYPECTOMY  12/17/2018  ? Procedure: POLYPECTOMY;  Surgeon: Lin Landsman, MD;  Location: [facility];  Service: Endoscopy;;  ? TOTAL THYROIDECTOMY  08/2017  ? no carcinoma  ? ?Family History  ?Problem Relation Age of Onset  ? Stroke Mother   ? Colon cancer Father   ? Prostate cancer Father   ? Breast cancer Maternal Aunt 22  ?  Liver cancer Maternal Aunt   ? Stomach cancer Maternal Aunt   ? ?Social History  ? ?Tobacco Use  ? Smoking status: Some Days  ?  Packs/day: 0.25  ?  Years: 15.00  ?  Pack years: 3.75  ?  Types: Cigarettes  ?  Last attempt to quit: 08/09/2017  ?  Years since quitting: 3.9  ? Smokeless tobacco: Never  ?Vaping Use  ? Vaping Use: Never used  ?Substance Use Topics  ? Alcohol use: Yes  ?  Comment: 1-2 drinks/month  ? Drug use: No  ? ? ?Current Outpatient Medications:  ?  Biotin 1 MG CAPS, Take by mouth., Disp: , Rfl:  ?  calcium acetate, Phos Binder, (PHOSLYRA) 667 MG/5ML SOLN, Take by mouth 3 (three) times daily with meals., Disp: , Rfl:  ?  Cholecalciferol (VITAMIN D3) 1.25 MG (50000 UT) CAPS, Take by mouth., Disp: , Rfl:  ?  SYNTHROID 88 MCG tablet, Take 1 tablet (88 mcg total) by mouth daily before breakfast., Disp: 90 tablet, Rfl: 2 ?Allergies: Patient has no known allergies. ? ?Review of Systems  ?Constitutional:  Negative for chills, fever and malaise/fatigue.  ?HENT:  Negative for congestion, sinus pain and sore throat.   ?Eyes:  Negative for blurred vision and pain.  ?Respiratory:  Negative for cough and wheezing.   ?Cardiovascular:  Negative for chest pain and leg swelling.  ?Gastrointestinal:  Negative for abdominal pain, constipation, diarrhea, heartburn, nausea and vomiting.  ?  Genitourinary:  Negative for dysuria, frequency, hematuria and urgency.  ?Musculoskeletal:  Negative for back pain, joint pain, myalgias and neck pain.  ?Skin:  Negative for itching and rash.  ?Neurological:  Negative for dizziness, tremors and weakness.  ?Endo/Heme/Allergies:  Does not bruise/bleed easily.  ?Psychiatric/Behavioral:  Negative for depression. The patient is not nervous/anxious and does not have insomnia.   ? ?Objective: ?BP 120/80   Ht 5' 2.5" (1.588 m)   Wt 158 lb (71.7 kg)   BMI 28.44 kg/m?   ?Filed Weights  ? 08/07/21 0903  ?Weight: 158 lb (71.7 kg)  ? Body mass index is 28.44 kg/m?Marland Kitchen ?Physical Exam ?Constitutional:    ?   General: She is not in acute distress. ?   Appearance: She is well-developed.  ?Genitourinary:  ?   Bladder, rectum and urethral meatus normal.  ?   No lesions in the vagina.  ?   Right Labia: No rash, tenderness or lesions. ?   Left Labia: No tenderness, lesions or rash. ?   No vaginal bleeding.  ? ?   Right Adnexa: not tender and no mass present. ?   Left Adnexa: not tender and no mass present. ?   Cervical polyp present.  ?   No cervical motion tenderness, friability or lesion.  ?   Cervical exam comments: 0.5cm polyp noted, wide base, at os, no bleeding.  ?   Uterus is not enlarged.  ?   No uterine mass detected. ?   Pelvic exam was performed with patient in the lithotomy position.  ?Breasts: ?   Right: No mass, skin change or tenderness.  ?   Left: No mass, skin change or tenderness.  ?HENT:  ?   Head: Normocephalic and atraumatic. No laceration.  ?   Right Ear: Hearing normal.  ?   Left Ear: Hearing normal.  ?   Mouth/Throat:  ?   Pharynx: Uvula midline.  ?Eyes:  ?   Pupils: Pupils are equal, round, and reactive to light.  ?Neck:  ?   Thyroid: No thyromegaly.  ?Cardiovascular:  ?   Rate and Rhythm: Normal rate and regular rhythm.  ?   Heart sounds: No murmur heard. ?  No friction rub. No gallop.  ?Pulmonary:  ?   Effort: Pulmonary effort is normal. No respiratory distress.  ?   Breath sounds: Normal breath sounds. No wheezing.  ?Abdominal:  ?   General: Bowel sounds are normal. There is no distension.  ?   Palpations: Abdomen is soft.  ?   Tenderness: There is no abdominal tenderness. There is no rebound.  ?Musculoskeletal:     ?   General: Normal range of motion.  ?   Cervical back: Normal range of motion and neck supple.  ?Neurological:  ?   Mental Status: She is alert and oriented to person, place, and time.  ?   Cranial Nerves: No cranial nerve deficit.  ?Skin: ?   General: Skin is warm and dry.  ?Psychiatric:     ?   Judgment: Judgment normal.  ?Vitals reviewed.  ? ? ?Assessment: Annual Exam ?1.  Women's annual routine gynecological examination   ?2. Screening for cervical cancer   ? ? ?Plan: ?           ?1.  Cervical Screening-  Pap smear done today ? ?2. Breast screening- Exam annually and mammogram scheduled ? ?3. Colonoscopy every 10 years, Hemoccult testing after age 63 ? ?39. Labs managed by PCP ? ?5. Counseling  for hormonal therapy: none ? ?            6. FRAX - FRAX score for assessing the 10 year probability for fracture calculated and discussed today.  Based on age and score today, DEXA is not currently scheduled. ? ? 7. Cervical polyp, no reported bleeding sx's.  Remove only if symptomatic.  Counseled low cancer risk ? ?  F/U ? Return for and Annual when due. ? ?Barnett Applebaum, MD, [place] ?[facility], [place] ?08/07/2021  9:21 AM ? ? ?

## 2021-08-08 LAB — CYTOLOGY - PAP
Comment: NEGATIVE
Diagnosis: NEGATIVE
High risk HPV: NEGATIVE

## 2021-08-10 ENCOUNTER — Telehealth: Payer: Self-pay

## 2021-08-10 NOTE — Telephone Encounter (Signed)
Pt aware.

## 2021-08-10 NOTE — Telephone Encounter (Signed)
Patient is calling to go over results. Patient isn't able to see in mychart. Patient is aware [facility]PH is out of the office on call. Please advise.

## 2021-08-10 NOTE — Telephone Encounter (Signed)
Let her know PAP is normal

## 2021-09-20 DIAGNOSIS — Z980 Intestinal bypass and anastomosis status: Secondary | ICD-10-CM | POA: Diagnosis not present

## 2021-09-20 DIAGNOSIS — K5730 Diverticulosis of large intestine without perforation or abscess without bleeding: Secondary | ICD-10-CM | POA: Diagnosis not present

## 2021-09-20 DIAGNOSIS — R933 Abnormal findings on diagnostic imaging of other parts of digestive tract: Secondary | ICD-10-CM | POA: Diagnosis not present

## 2021-09-21 ENCOUNTER — Other Ambulatory Visit: Payer: Self-pay | Admitting: Internal Medicine

## 2021-09-25 ENCOUNTER — Encounter: Payer: Self-pay | Admitting: Podiatry

## 2021-09-25 ENCOUNTER — Ambulatory Visit (INDEPENDENT_AMBULATORY_CARE_PROVIDER_SITE_OTHER): Payer: Medicare HMO | Admitting: Podiatry

## 2021-09-25 DIAGNOSIS — Q828 Other specified congenital malformations of skin: Secondary | ICD-10-CM | POA: Diagnosis not present

## 2021-09-25 NOTE — Progress Notes (Signed)
?  Subjective:  ?Patient ID: Kristin Escobar, female    DOB: [birth year],  MRN: [id number] ? ?Chief Complaint  ?Patient presents with  ? Callouses  ?  NP/ I HAVE CALLUSES / NREQ  ? ? ?71 y.o. female presents with the above complaint. History confirmed with patient.  She has 1 on each foot though on the right foot is the most painful ? ?Objective:  ?Physical Exam: ?warm, good capillary refill, no trophic changes or ulcerative lesions, normal DP and PT pulses, normal sensory exam, and plantar lateral midfoot bilateral there is a small porokeratosis the 1 on the right is more tender. ? ?Assessment:  ? ?1. Porokeratosis   ? ? ? ?Plan:  ?Patient was evaluated and treated and all questions answered. ? ?Discussed etiology of porokeratosis and calluses on the bottom of the foot and why they are so painful.  Debrided the lesions as a courtesy today and applied salinocaine cream.  We discussed that routine debridements are not covered by Medicare but we are happy to do them.  Her pedicurist will not take care of them for her.  I recommended using salicylic acid or cream at home such as wart or corn remover until the lesions no longer bother her.  She will return as needed. ? ?Return if symptoms worsen or fail to improve.  ? ?

## 2021-09-25 NOTE — Patient Instructions (Signed)
Look for salicylic acid cream or liquid 17-27% strength at the pharmacy in the foot care section. Sometimes it's listed as corn or wart remover. Use it for a few days at a time until the skin peels a layer, then let it rest and reapply after.  ?

## 2021-10-16 ENCOUNTER — Ambulatory Visit (INDEPENDENT_AMBULATORY_CARE_PROVIDER_SITE_OTHER): Payer: Medicare HMO | Admitting: Internal Medicine

## 2021-10-16 ENCOUNTER — Encounter: Payer: Self-pay | Admitting: Internal Medicine

## 2021-10-16 VITALS — BP 104/60 | HR 78 | Ht 62.5 in | Wt 161.0 lb

## 2021-10-16 DIAGNOSIS — Z0000 Encounter for general adult medical examination without abnormal findings: Secondary | ICD-10-CM | POA: Diagnosis not present

## 2021-10-16 DIAGNOSIS — C7A8 Other malignant neuroendocrine tumors: Secondary | ICD-10-CM | POA: Diagnosis not present

## 2021-10-16 DIAGNOSIS — R102 Pelvic and perineal pain: Secondary | ICD-10-CM

## 2021-10-16 DIAGNOSIS — R7303 Prediabetes: Secondary | ICD-10-CM

## 2021-10-16 DIAGNOSIS — E782 Mixed hyperlipidemia: Secondary | ICD-10-CM

## 2021-10-16 DIAGNOSIS — Z1231 Encounter for screening mammogram for malignant neoplasm of breast: Secondary | ICD-10-CM

## 2021-10-16 DIAGNOSIS — E890 Postprocedural hypothyroidism: Secondary | ICD-10-CM

## 2021-10-16 DIAGNOSIS — K5730 Diverticulosis of large intestine without perforation or abscess without bleeding: Secondary | ICD-10-CM | POA: Diagnosis not present

## 2021-10-16 LAB — POCT URINALYSIS DIPSTICK
Bilirubin, UA: NEGATIVE
Glucose, UA: NEGATIVE
Ketones, UA: NEGATIVE
Leukocytes, UA: NEGATIVE
Nitrite, UA: NEGATIVE
Protein, UA: NEGATIVE
Spec Grav, UA: 1.015 (ref 1.010–1.025)
Urobilinogen, UA: 0.2 E.U./dL
pH, UA: 5 (ref 5.0–8.0)

## 2021-10-16 NOTE — Progress Notes (Signed)
lower   Date:  10/16/2021   Name:  Kristin Escobar   DOB:  February 08, 1951   MRN:  [id number]   Chief Complaint: Annual Exam Kristin Escobar is a 71 y.o. female who presents today for her Complete Annual Exam. She feels well. She reports no exercising. She reports she is sleeping poorly. Breast complaints none.  No recurrent episodes of diverticulitis.  Mammogram: 11/2020 DEXA: 11/2020 osteopenia hip/normal other sites Pap smear: discontinued Colonoscopy: 08/2021  Health Maintenance Due  Topic Date Due   TETANUS/TDAP  Never done    Immunization History  Administered Date(s) Administered   Influenza, High Dose Seasonal PF 04/02/2018, 02/10/2019, 01/31/2020, 02/28/2021   PFIZER(Purple Top)SARS-COV-2 Vaccination 07/22/2019, 08/12/2019, 02/22/2020, 11/02/2020   Pfizer Covid-19 Vaccine Bivalent Booster 77yr & up 02/28/2021   Pneumococcal Conjugate-13 06/14/2017, 04/02/2018   Pneumococcal Polysaccharide-23 10/07/2018, 01/31/2020   Zoster Recombinat (Shingrix) 03/13/2018, 02/10/2019    Thyroid Problem Presents for follow-up visit. Patient reports no anxiety, constipation, diarrhea, fatigue, palpitations or tremors. The symptoms have been stable.  She has noticed hair loss generalized - no scalp lesions or bald patches.  Energy level is good.  Appetite seems to be decreased but weight is stable.  Lab Results  Component Value Date   NA 139 10/12/2020   K 4.1 10/12/2020   CO2 25 10/12/2020   GLUCOSE 92 10/12/2020   BUN 12 10/12/2020   CREATININE 0.90 10/12/2020   CALCIUM 8.8 10/12/2020   EGFR 69 10/12/2020   GFRNONAA 57 (L) 10/08/2019   Lab Results  Component Value Date   CHOL 196 10/12/2020   HDL 56 10/12/2020   LDLCALC 116 (H) 10/12/2020   TRIG 135 10/12/2020   CHOLHDL 3.5 10/12/2020   Lab Results  Component Value Date   TSH 6.770 (H) 04/17/2021   Lab Results  Component Value Date   HGBA1C 5.6 10/12/2020   Lab Results  Component Value Date   WBC 10.3 04/17/2021    HGB 13.4 04/17/2021   HCT 38.7 04/17/2021   MCV 95 04/17/2021   PLT 249 04/17/2021   Lab Results  Component Value Date   ALT 22 10/12/2020   AST 21 10/12/2020   ALKPHOS 93 10/12/2020   BILITOT 0.6 10/12/2020   Lab Results  Component Value Date   VD25OH 20.9 (L) 10/12/2020     Review of Systems  Constitutional:  Negative for chills, fatigue and fever.  HENT:  Negative for congestion, hearing loss, tinnitus, trouble swallowing and voice change.   Eyes:  Negative for visual disturbance.  Respiratory:  Negative for cough, chest tightness, shortness of breath and wheezing.   Cardiovascular:  Negative for chest pain, palpitations and leg swelling.  Gastrointestinal:  Positive for abdominal pain. Negative for constipation, diarrhea, nausea and vomiting.  Endocrine: Negative for polydipsia and polyuria.  Genitourinary:  Positive for frequency and urgency. Negative for dysuria, genital sores, vaginal bleeding and vaginal discharge.  Musculoskeletal:  Negative for arthralgias, gait problem and joint swelling.  Skin:  Negative for color change and rash.       Hair loss - Also flat lesions on left fingers  Neurological:  Negative for dizziness, tremors, light-headedness and headaches.  Hematological:  Negative for adenopathy. Does not bruise/bleed easily.  Psychiatric/Behavioral:  Negative for dysphoric mood and sleep disturbance. The patient is not nervous/anxious.    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   Nocturnal leg cramps 06/14/2017    No Known  Allergies  Past Surgical History:  Procedure Laterality Date   COLONOSCOPY WITH PROPOFOL N/A 07/17/2017   Procedure: COLONOSCOPY WITH PROPOFOL;  Surgeon: Lin Landsman, MD;  Location: [facility];  Service: Endoscopy;  Laterality: N/A;   COLONOSCOPY WITH PROPOFOL N/A 12/17/2018   Procedure: COLONOSCOPY WITH PROPOFOL;  Surgeon: Lin Landsman, MD;  Location: [place];  Service: Endoscopy;  Laterality: N/A;   LAPAROSCOPIC ILEOCECECTOMY  09/2017   neuroendocrine tumor   POLYPECTOMY  07/17/2017   Procedure: POLYPECTOMY;  Surgeon: Lin Landsman, MD;  Location: [facility];  Service: Endoscopy;;   POLYPECTOMY  12/17/2018   Procedure: POLYPECTOMY;  Surgeon: Lin Landsman, MD;  Location: [facility];  Service: Endoscopy;;   TOTAL THYROIDECTOMY  08/2017   no carcinoma    Social History   Tobacco Use   Smoking status: Some Days    Packs/day: 0.25    Years: 15.00    Pack years: 3.75    Types: Cigarettes    Last attempt to quit: 08/09/2017    Years since quitting: 4.1   Smokeless tobacco: Never  Vaping Use   Vaping Use: Never used  Substance Use Topics   Alcohol use: Yes    Comment: 1-2 drinks/month   Drug use: No     Medication list has been reviewed and updated.  Current Meds  Medication Sig   albuterol (VENTOLIN HFA) 108 (90 Base) MCG/ACT inhaler SMARTSIG:2 Puff(s) By Mouth Every 6 Hours PRN   cholestyramine (QUESTRAN) 4 g packet Take 1 packet by mouth daily.   SYNTHROID 88 MCG tablet Take 1 tablet (88 mcg total) by mouth daily before breakfast.       10/16/2021    8:59 AM 04/17/2021    9:52 AM 04/17/2021    9:44 AM 10/12/2020    8:51 AM  GAD 7 : Generalized Anxiety Score  Nervous, Anxious, on Edge 0 0 0 0  Control/stop worrying 2 3 0 1  Worry too much - different things 2 3 0 1  Trouble relaxing 0 0 0 0  Restless 0 0 0 0  Easily annoyed or irritable 0 0 0 0  Afraid - awful might happen 0 0 0 0  Total GAD 7 Score 4  6 0 2  Anxiety Difficulty Not difficult at all Not difficult at all Not difficult at all        10/16/2021    8:59 AM  Depression screen PHQ 2/9  Decreased Interest 0  Down, Depressed, Hopeless 0  PHQ - 2 Score 0  Altered sleeping 2  Tired, decreased energy 2  Change in appetite 2  Feeling bad or failure about yourself  0  Trouble concentrating 0  Moving slowly or fidgety/restless 0  Suicidal thoughts 0  PHQ-9 Score 6  Difficult doing work/chores Not difficult at all    BP Readings from Last 3 Encounters:  10/16/21 104/60  08/07/21 120/80  04/17/21 122/68    Physical Exam Vitals and nursing note reviewed.  Constitutional:      General: She is not in acute distress.    Appearance: She is well-developed.  HENT:  Head: Normocephalic and atraumatic.     Right Ear: Tympanic membrane and ear canal normal.     Left Ear: Tympanic membrane and ear canal normal.     Nose:     Right Sinus: No maxillary sinus tenderness.     Left Sinus: No maxillary sinus tenderness.  Eyes:     General: No scleral icterus.       Right eye: No discharge.        Left eye: No discharge.     Conjunctiva/sclera: Conjunctivae normal.  Neck:     Thyroid: No thyromegaly.     Vascular: No carotid bruit.  Cardiovascular:     Rate and Rhythm: Normal rate and regular rhythm.     Pulses: Normal pulses.     Heart sounds: Normal heart sounds.  Pulmonary:     Effort: Pulmonary effort is normal. No respiratory distress.     Breath sounds: No wheezing.  Abdominal:     General: Bowel sounds are normal.     Palpations: Abdomen is soft.     Tenderness: There is abdominal tenderness in the suprapubic area. There is no right CVA tenderness, left CVA tenderness, guarding or rebound.  Musculoskeletal:     Cervical back: Normal range of motion. No erythema.     Right lower leg: No edema.     Left lower leg: No edema.  Lymphadenopathy:     Cervical: No cervical adenopathy.  Skin:    General: Skin is warm  and dry.     Findings: No rash.     Comments: Scalp normal Flat lesions on dorsum of several fingers of left hand - appear to be flat warts  Neurological:     Mental Status: She is alert and oriented to person, place, and time.     Cranial Nerves: No cranial nerve deficit.     Sensory: No sensory deficit.     Deep Tendon Reflexes: Reflexes are normal and symmetric.  Psychiatric:        Attention and Perception: Attention normal.        Mood and Affect: Mood normal.    Wt Readings from Last 3 Encounters:  10/16/21 161 lb (73 kg)  08/07/21 158 lb (71.7 kg)  04/17/21 158 lb (71.7 kg)    BP 104/60   Pulse 78   Ht 5' 2.5" (1.588 m)   Wt 161 lb (73 kg)   SpO2 94%   BMI 28.98 kg/m   Assessment and Plan: 1. Annual physical exam Normal exam Continue healthy diet, regular exercise - CBC with Differential/Platelet  2. Encounter for screening mammogram for breast cancer Schedule at [facility] - MM 3D SCREEN BREAST BILATERAL  3. Postoperative hypothyroidism Supplemented but with so hair loss, etc Will check labs and consider medication adjustment - TSH + free T4  4. Mixed hyperlipidemia Check labs and advise Continue low fat diet - Lipid panel  5. Prediabetes Weight is stable - diet is unchanged - Comprehensive metabolic panel - Hemoglobin A1c  6. Neuroendocrine carcinoma ([place]) Most recent colonoscopy in 4.2023 was normal with intact surgical anastamosis site and no tumor seen.  7. Suprapubic discomfort Without any red flag signs. She will monitor for progression and seek care. UA dip + for blood but microscopic exam was benign. - POCT urinalysis dipstick   Partially dictated using Editor, commissioning. Any errors are unintentional.  Halina Maidens, MD [facility]  10/16/2021

## 2021-10-17 ENCOUNTER — Other Ambulatory Visit: Payer: Self-pay | Admitting: Internal Medicine

## 2021-10-17 DIAGNOSIS — E890 Postprocedural hypothyroidism: Secondary | ICD-10-CM

## 2021-10-17 LAB — COMPREHENSIVE METABOLIC PANEL
ALT: 16 IU/L (ref 0–32)
AST: 19 IU/L (ref 0–40)
Albumin/Globulin Ratio: 1.4 (ref 1.2–2.2)
Albumin: 4.1 g/dL (ref 3.8–4.8)
Alkaline Phosphatase: 95 IU/L (ref 44–121)
BUN/Creatinine Ratio: 17 (ref 12–28)
BUN: 16 mg/dL (ref 8–27)
Bilirubin Total: 0.4 mg/dL (ref 0.0–1.2)
CO2: 23 mmol/L (ref 20–29)
Calcium: 9.3 mg/dL (ref 8.7–10.3)
Chloride: 102 mmol/L (ref 96–106)
Creatinine, Ser: 0.93 mg/dL (ref 0.57–1.00)
Globulin, Total: 3 g/dL (ref 1.5–4.5)
Glucose: 100 mg/dL — ABNORMAL HIGH (ref 70–99)
Potassium: 4.4 mmol/L (ref 3.5–5.2)
Sodium: 139 mmol/L (ref 134–144)
Total Protein: 7.1 g/dL (ref 6.0–8.5)
eGFR: 66 mL/min/{1.73_m2} (ref >59)

## 2021-10-17 LAB — CBC WITH DIFFERENTIAL/PLATELET
Basophils Absolute: 0 10*3/uL (ref 0.0–0.2)
Basos: 1 %
EOS (ABSOLUTE): 0.2 10*3/uL (ref 0.0–0.4)
Eos: 3 %
Hematocrit: 41.1 % (ref 34.0–46.6)
Hemoglobin: 13.8 g/dL (ref 11.1–15.9)
Immature Grans (Abs): 0 10*3/uL (ref 0.0–0.1)
Immature Granulocytes: 0 %
Lymphocytes Absolute: 1.8 10*3/uL (ref 0.7–3.1)
Lymphs: 28 %
MCH: 32 pg (ref 26.6–33.0)
MCHC: 33.6 g/dL (ref 31.5–35.7)
MCV: 95 fL (ref 79–97)
Monocytes Absolute: 0.4 10*3/uL (ref 0.1–0.9)
Monocytes: 7 %
Neutrophils Absolute: 3.9 10*3/uL (ref 1.4–7.0)
Neutrophils: 61 %
Platelets: 239 10*3/uL (ref 150–450)
RBC: 4.31 x10E6/uL (ref 3.77–5.28)
RDW: 13.7 % (ref 11.7–15.4)
WBC: 6.4 10*3/uL (ref 3.4–10.8)

## 2021-10-17 LAB — TSH+FREE T4
Free T4: 1.5 ng/dL (ref 0.82–1.77)
TSH: 5.4 u[IU]/mL — ABNORMAL HIGH (ref 0.450–4.500)

## 2021-10-17 LAB — LIPID PANEL
Chol/HDL Ratio: 3.6 ratio (ref 0.0–4.4)
Cholesterol, Total: 197 mg/dL (ref 100–199)
HDL: 54 mg/dL (ref >39)
LDL Chol Calc (NIH): 113 mg/dL — ABNORMAL HIGH (ref 0–99)
Triglycerides: 173 mg/dL — ABNORMAL HIGH (ref 0–149)
VLDL Cholesterol Cal: 30 mg/dL (ref 5–40)

## 2021-10-17 LAB — HEMOGLOBIN A1C
Est. average glucose Bld gHb Est-mCnc: 114 mg/dL
Hgb A1c MFr Bld: 5.6 % (ref 4.8–5.6)

## 2021-10-17 MED ORDER — LEVOTHYROXINE SODIUM 100 MCG PO TABS: 100.0000 ug | ORAL_TABLET | Freq: Every day | ORAL | 3 refills | Status: DC

## 2022-02-20 ENCOUNTER — Ambulatory Visit: Payer: Medicare HMO | Admitting: Internal Medicine

## 2022-02-20 ENCOUNTER — Ambulatory Visit: Payer: Self-pay

## 2022-02-20 DIAGNOSIS — F17210 Nicotine dependence, cigarettes, uncomplicated: Secondary | ICD-10-CM | POA: Diagnosis not present

## 2022-02-20 DIAGNOSIS — R109 Unspecified abdominal pain: Secondary | ICD-10-CM | POA: Diagnosis not present

## 2022-02-20 DIAGNOSIS — R112 Nausea with vomiting, unspecified: Secondary | ICD-10-CM | POA: Diagnosis not present

## 2022-02-20 DIAGNOSIS — N179 Acute kidney failure, unspecified: Secondary | ICD-10-CM | POA: Diagnosis not present

## 2022-02-20 DIAGNOSIS — R69 Illness, unspecified: Secondary | ICD-10-CM | POA: Diagnosis not present

## 2022-02-20 DIAGNOSIS — E890 Postprocedural hypothyroidism: Secondary | ICD-10-CM | POA: Diagnosis not present

## 2022-02-20 DIAGNOSIS — R197 Diarrhea, unspecified: Secondary | ICD-10-CM | POA: Diagnosis not present

## 2022-02-20 DIAGNOSIS — Z79890 Hormone replacement therapy (postmenopausal): Secondary | ICD-10-CM | POA: Diagnosis not present

## 2022-02-20 DIAGNOSIS — R9431 Abnormal electrocardiogram [ECG] [EKG]: Secondary | ICD-10-CM | POA: Diagnosis not present

## 2022-02-20 DIAGNOSIS — R5383 Other fatigue: Secondary | ICD-10-CM | POA: Diagnosis not present

## 2022-02-20 DIAGNOSIS — Z6828 Body mass index (BMI) 28.0-28.9, adult: Secondary | ICD-10-CM | POA: Diagnosis not present

## 2022-02-20 DIAGNOSIS — K529 Noninfective gastroenteritis and colitis, unspecified: Secondary | ICD-10-CM | POA: Diagnosis not present

## 2022-02-20 NOTE — Telephone Encounter (Signed)
Summary: diarrhea and vomiting   Pt states she has been having diarrhea and vomiting x3d   Pt states she ate a sausage dog and a few hours later the symptoms started   Pt states if she does not answer leave a message   Please fu w/       Chief Complaint: requesting appt N/V x 3 days after eating "sausage dog" Symptoms: vomiting this am yellowish emesis with streak of blood. Diarrhea every time coughing hx colon CA. Lightheaded, urinated x 1 this am . Drinking sips of Gatorade. Not tolerating food. Hx diverticulitis Frequency: 3 days  Pertinent Negatives: Patient denies fever.  Disposition: '[x]'$ ED /'[]'$ Urgent Care (no appt availability in office) / '[]'$ Appointment(In office/virtual)/ '[]'$  [facility]/ '[]'$ Home Care/ '[x]'$ Refused Recommended Disposition /'[]'$ [facility]/ '[]'$  Follow-up with PCP Additional Notes:   Recommended ED. Patient refused due to multiple times requiring hospitalization. Requesting appt with PCP. Called [facility] to inform of refused recommendations.       Reason for Disposition  [1] Drinking very little AND [2] dehydration suspected (e.g., no urine > 12 hours, very dry mouth, very lightheaded)  Answer Assessment - Initial Assessment Questions 1. VOMITING SEVERITY: "How many times have you vomited in the past 24 hours?"     - MILD:  1 - 2 times/day    - MODERATE: 3 - 5 times/day, decreased oral intake without significant weight loss or symptoms of dehydration    - SEVERE: 6 or more times/day, vomits everything or nearly everything, with significant weight loss, symptoms of dehydration      Vomiting yellowish emesis with streaks of blood in it  2. ONSET: "When did the vomiting begin?"      Saturday 02/17/22 3. FLUIDS: "What fluids or food have you vomited up today?" "Have you been able to keep any fluids down?"     Gatorade only in small sips. No food  4. ABDOMEN PAIN: "Are your having any abdomen pain?" If Yes : "How bad is it and what does it feel  like?" (e.g., crampy, dull, intermittent, constant)      No  5. DIARRHEA: "Is there any diarrhea?" If Yes, ask: "How many times today?"      When coughing noted noted diarrhea. Hx due to colon CA 6. CONTACTS: "Is there anyone else in the family with the same symptoms?"      no 7. CAUSE: "What do you think is causing your vomiting?"     Ate sausage dog. 8. HYDRATION STATUS: "Any signs of dehydration?" (e.g., dry mouth [not only dry lips], too weak to stand) "When did you last urinate?"     Urine x 1 today lightheaded. Can stand  9. OTHER SYMPTOMS: "Do you have any other symptoms?" (e.g., fever, headache, vertigo, vomiting blood or coffee grounds, recent head injury)     Vomiting blood or streaks of blood with diarrhea at times  10. PREGNANCY: "Is there any chance you are pregnant?" "When was your last menstrual period?"       na  Protocols used: Vomiting-A-AH

## 2022-02-20 NOTE — Telephone Encounter (Signed)
Called pt told her that I understand that she does not want to go to the ER. Told pt we could see her at 4:00 PM today but we would only be able to give her nausea medication. Stated to pt that we wont be able to give her IV fluids that the ER or UC would be able to do that. Pt stated "ok, thank you" and was not longer on the phone.   KP

## 2022-02-20 NOTE — Telephone Encounter (Signed)
Summary: diarrhea and vomiting   Pt states she has been having diarrhea and vomiting x3d   Pt states she ate a sausage dog and a few hours later the symptoms started   Pt states if she does not answer leave a message   Please fu w/ pt    Left message to call back about symtoms

## 2022-02-22 DIAGNOSIS — N179 Acute kidney failure, unspecified: Secondary | ICD-10-CM | POA: Diagnosis not present

## 2022-03-09 ENCOUNTER — Ambulatory Visit: Payer: Self-pay | Admitting: *Deleted

## 2022-03-09 ENCOUNTER — Encounter: Payer: Self-pay | Admitting: Internal Medicine

## 2022-03-09 ENCOUNTER — Ambulatory Visit (INDEPENDENT_AMBULATORY_CARE_PROVIDER_SITE_OTHER): Payer: Medicare HMO | Admitting: Internal Medicine

## 2022-03-09 VITALS — BP 100/62 | HR 85 | Ht 62.5 in | Wt 157.0 lb

## 2022-03-09 DIAGNOSIS — J0100 Acute maxillary sinusitis, unspecified: Secondary | ICD-10-CM | POA: Diagnosis not present

## 2022-03-09 DIAGNOSIS — K529 Noninfective gastroenteritis and colitis, unspecified: Secondary | ICD-10-CM | POA: Diagnosis not present

## 2022-03-09 MED ORDER — AMOXICILLIN-POT CLAVULANATE 875-125 MG PO TABS: 1.0000 | ORAL_TABLET | Freq: Two times a day (BID) | ORAL | 0 refills | Status: AC

## 2022-03-09 MED ORDER — PROMETHAZINE-DM 6.25-15 MG/5ML PO SYRP: 5.0000 mL | ORAL_SOLUTION | Freq: Four times a day (QID) | ORAL | 0 refills | Status: AC | PRN

## 2022-03-09 NOTE — Telephone Encounter (Signed)
  Chief Complaint: congestion Symptoms: cough congestion, thick green mucus Frequency: 2 weeks  Pertinent Negatives: Patient denies SOB fever Disposition: '[]'$ ED /'[]'$ Urgent Care (no appt availability in office) / '[x]'$ Appointment(In office/virtual)/ '[]'$  [facility]/ '[]'$ Home Care/ '[]'$ Refused Recommended Disposition /'[]'$ [facility]/ '[]'$  Follow-up with PCP Additional Notes: pt is asking for some cough syrup and nasal spray for sx. States she has tested negative for COVID several times now. Advised pt visit would be needed and scheduled VV at 1340 today.   Reason for Disposition  [1] Nasal discharge AND [2] present > 10 days  Answer Assessment - Initial Assessment Questions 1. ONSET: "When did the nasal discharge start?"      2 weeks  2. AMOUNT: "How much discharge is there?"      Moderate  3. COUGH: "Do you have a cough?" If Yes, ask: "Describe the color of your sputum" (clear, white, yellow, green)     Thick green mucus 4. RESPIRATORY DISTRESS: "Describe your breathing."      No SOB 5. FEVER: "Do you have a fever?" If Yes, ask: "What is your temperature, how was it measured, and when did it start?"     no 7. OTHER SYMPTOMS: "Do you have any other symptoms?" (e.g., sore throat, earache, wheezing, vomiting)     Congestion, cough  Protocols used: Common Cold-A-AH

## 2022-03-09 NOTE — Progress Notes (Signed)
Date:  03/09/2022   Name:  Kristin Escobar   DOB:  [birth year]   MRN:  [id number]   Chief Complaint: Cough  Cough This is a new problem. The current episode started 1 to 4 weeks ago. The cough is Productive of sputum. Pertinent negatives include no chest pain, chills, ear pain, fever, headaches, myalgias, shortness of breath or wheezing.  GI Problem The primary symptoms include nausea, vomiting and diarrhea. Primary symptoms do not include fever, fatigue or myalgias. Episode onset: seen in ER at [facility] 2 weeks ago - got fluids. The problem has been resolved.  The illness does not include chills.  She requested that I review her labs and imaging from that visit.   Lab Results  Component Value Date   NA 139 10/16/2021   K 4.4 10/16/2021   CO2 23 10/16/2021   GLUCOSE 100 (H) 10/16/2021   BUN 16 10/16/2021   CREATININE 0.93 10/16/2021   CALCIUM 9.3 10/16/2021   EGFR 66 10/16/2021   GFRNONAA 57 (L) 10/08/2019   Lab Results  Component Value Date   CHOL 197 10/16/2021   HDL 54 10/16/2021   LDLCALC 113 (H) 10/16/2021   TRIG 173 (H) 10/16/2021   CHOLHDL 3.6 10/16/2021   Lab Results  Component Value Date   TSH 5.400 (H) 10/16/2021   Lab Results  Component Value Date   HGBA1C 5.6 10/16/2021   Lab Results  Component Value Date   WBC 6.4 10/16/2021   HGB 13.8 10/16/2021   HCT 41.1 10/16/2021   MCV 95 10/16/2021   PLT 239 10/16/2021   Lab Results  Component Value Date   ALT 16 10/16/2021   AST 19 10/16/2021   ALKPHOS 95 10/16/2021   BILITOT 0.4 10/16/2021   Lab Results  Component Value Date   VD25OH 20.9 (L) 10/12/2020     Review of Systems  Constitutional:  Negative for chills, fatigue and fever.  HENT:  Positive for congestion and sinus pressure. Negative for ear pain, hearing loss and trouble swallowing.   Respiratory:  Positive for cough. Negative for shortness of breath and wheezing.   Cardiovascular:  Negative for chest pain.  Gastrointestinal:  Positive  for diarrhea, nausea and vomiting.  Musculoskeletal:  Negative for myalgias.  Neurological:  Negative for headaches.    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[place]) 07/24/2017   Tobacco use disorder 07/12/2017   Vitamin D deficiency 06/14/2017   Prediabetes 06/14/2017    No Known Allergies  Past Surgical History:  Procedure Laterality Date   COLONOSCOPY WITH PROPOFOL N/A 07/17/2017   Procedure: COLONOSCOPY WITH PROPOFOL;  Surgeon: Lin Landsman, MD;  Location: [place];  Service: Endoscopy;  Laterality: N/A;   COLONOSCOPY WITH PROPOFOL N/A 12/17/2018   Procedure: COLONOSCOPY WITH PROPOFOL;  Surgeon: Lin Landsman, MD;  Location: [place];  Service: Endoscopy;  Laterality: N/A;   LAPAROSCOPIC ILEOCECECTOMY  09/2017   neuroendocrine tumor   POLYPECTOMY  07/17/2017   Procedure: POLYPECTOMY;  Surgeon: Lin Landsman, MD;  Location: [place];  Service: Endoscopy;;   POLYPECTOMY  12/17/2018   Procedure: POLYPECTOMY;  Surgeon: Lin Landsman, MD;  Location: [place];  Service: Endoscopy;;   TOTAL THYROIDECTOMY  08/2017   no carcinoma    Social History   Tobacco Use   Smoking status:  Some Days    Packs/day: 0.25    Years: 15.00    Total pack years: 3.75    Types: Cigarettes    Last attempt to quit: 08/09/2017    Years since quitting: 4.5   Smokeless tobacco: Never  Vaping Use   Vaping Use: Never used  Substance Use Topics   Alcohol use: Yes    Comment: 1-2 drinks/month   Drug use: No     Medication list has been reviewed and updated.  Current Meds  Medication Sig   amoxicillin-clavulanate (AUGMENTIN) 875-125 MG tablet Take 1 tablet by mouth 2 (two) times daily for 10 days.   levothyroxine (SYNTHROID) 100 MCG tablet Take 1 tablet (100 mcg  total) by mouth daily.   promethazine-dextromethorphan (PROMETHAZINE-DM) 6.25-15 MG/5ML syrup Take 5 mLs by mouth 4 (four) times daily as needed for up to 9 days for cough.       03/09/2022    1:43 PM 10/16/2021    8:59 AM 04/17/2021    9:52 AM 04/17/2021    9:44 AM  GAD 7 : Generalized Anxiety Score  Nervous, Anxious, on Edge 0 0 0 0  Control/stop worrying 0 2 3 0  Worry too much - different things 0 2 3 0  Trouble relaxing 0 0 0 0  Restless 0 0 0 0  Easily annoyed or irritable 0 0 0 0  Afraid - awful might happen 0 0 0 0  Total GAD 7 Score 0 4 6 0  Anxiety Difficulty Not difficult at all Not difficult at all Not difficult at all Not difficult at all       03/09/2022    1:43 PM 10/16/2021    8:59 AM 04/17/2021    9:51 AM  Depression screen PHQ 2/9  Decreased Interest 0 0 0  Down, Depressed, Hopeless 0 0 0  PHQ - 2 Score 0 0 0  Altered sleeping 0 2 0  Tired, decreased energy 3 2 0  Change in appetite 3 2 0  Feeling bad or failure about yourself  0 0 0  Trouble concentrating 0 0 0  Moving slowly or fidgety/restless 0 0 0  Suicidal thoughts 0 0 0  PHQ-9 Score 6 6 0  Difficult doing work/chores Not difficult at all Not difficult at all Not difficult at all    BP Readings from Last 3 Encounters:  03/09/22 100/62  10/16/21 104/60  08/07/21 120/80    Physical Exam Vitals and nursing note reviewed.  Constitutional:      General: She is not in acute distress.    Appearance: Normal appearance. She is well-developed.  HENT:     Head: Normocephalic and atraumatic.     Right Ear: No middle ear effusion. Tympanic membrane is retracted. Tympanic membrane is not erythematous.     Left Ear:  No middle ear effusion. Tympanic membrane is retracted. Tympanic membrane is not erythematous.     Nose:     Right Sinus: No maxillary sinus tenderness or frontal sinus tenderness.     Left Sinus: No maxillary sinus tenderness or frontal sinus tenderness.  Cardiovascular:     Rate and  Rhythm: Normal rate and regular rhythm.     Pulses: Normal pulses.  Pulmonary:     Effort: Pulmonary effort is normal. No respiratory distress.     Breath sounds: Normal breath sounds and air entry. No wheezing or rhonchi.  Musculoskeletal:     Cervical back: Normal range of motion.  Lymphadenopathy:  Cervical: No cervical adenopathy.  Skin:    General: Skin is warm and dry.     Findings: No rash.  Neurological:     Mental Status: She is alert and oriented to person, place, and time.  Psychiatric:        Mood and Affect: Mood normal.        Behavior: Behavior normal.     Wt Readings from Last 3 Encounters:  03/09/22 157 lb (71.2 kg)  10/16/21 161 lb (73 kg)  08/07/21 158 lb (71.7 kg)    BP 100/62   Pulse 85   Ht 5' 2.5" (1.588 m)   Wt 157 lb (71.2 kg)   SpO2 96%   BMI 28.26 kg/m   Assessment and Plan: 1. Acute non-recurrent maxillary sinusitis Recommend Coricidin HBP for congestion symptoms  Push fluids Follow up if no improvement with treatment - amoxicillin-clavulanate (AUGMENTIN) 875-125 MG tablet; Take 1 tablet by mouth 2 (two) times daily for 10 days.  Dispense: 20 tablet; Refill: 0 - promethazine-dextromethorphan (PROMETHAZINE-DM) 6.25-15 MG/5ML syrup; Take 5 mLs by mouth 4 (four) times daily as needed for up to 9 days for cough.  Dispense: 118 mL; Refill: 0  2. Gastroenteritis Recent episode completely resolved. Imaging and labs reviewed with patient.   No follow up labs are needed unless symptoms recur.   Partially dictated using Editor, commissioning. Any errors are unintentional.  Halina Maidens, MD [facility]  03/09/2022

## 2022-03-09 NOTE — Patient Instructions (Signed)
Coricidin HBP - take as directed for congestion

## 2022-03-09 NOTE — Telephone Encounter (Signed)
The patient has shared that they've experienced a cough for roughly two weeks   The patient shares that when they cough they notice a thick green mucus   The patient has tested negative for COVID   The patient has requested a prescription for their discomfort   Please contact further when possible     Attempted to reach pt, left VM to call back.

## 2022-03-19 ENCOUNTER — Telehealth: Payer: Self-pay | Admitting: Internal Medicine

## 2022-03-19 NOTE — Telephone Encounter (Signed)
Pt would like to cancel AWV appt on 03/21/22

## 2022-03-21 ENCOUNTER — Ambulatory Visit: Payer: Medicare HMO

## 2022-03-27 ENCOUNTER — Ambulatory Visit
Admission: RE | Admit: 2022-03-27 | Discharge: 2022-03-27 | Disposition: A | Discharge status: Home / Self Care | Payer: Medicare HMO | Source: Ambulatory Visit | Attending: Internal Medicine | Admitting: Internal Medicine

## 2022-03-27 DIAGNOSIS — Z1231 Encounter for screening mammogram for malignant neoplasm of breast: Secondary | ICD-10-CM | POA: Diagnosis not present

## 2022-03-30 ENCOUNTER — Ambulatory Visit (INDEPENDENT_AMBULATORY_CARE_PROVIDER_SITE_OTHER): Payer: Medicare HMO

## 2022-03-30 DIAGNOSIS — Z0000 Encounter for general adult medical examination without abnormal findings: Secondary | ICD-10-CM

## 2022-03-30 NOTE — Progress Notes (Signed)
I connected with  Kristin Escobar on 03/30/22 by a audio enabled telemedicine application and verified that I am speaking with the correct person using two identifiers.  Patient Location: Home  Provider Location: Office/Clinic  I discussed the limitations of evaluation and management by telemedicine. The patient expressed understanding and agreed to proceed.  Subjective:   Kristin Escobar is a 71 y.o. female who presents for Medicare Annual (Subsequent) preventive examination.  Review of Systems    Per HPI unless specifically indicated below.        Objective:       03/09/2022    1:35 PM 10/16/2021    8:51 AM 08/07/2021    9:03 AM  Vitals with BMI  Height 5' 2.5" 5' 2.5" 5' 2.5"  Weight 157 lbs 161 lbs 158 lbs  BMI 28.24 59.56 38.75  Systolic 643 329 518  Diastolic 62 60 80  Pulse 85 78     Today's Vitals   03/30/22 1508  PainSc: 0-No pain   There is no height or weight on file to calculate BMI.     03/20/2021    2:18 PM 03/16/2020    2:20 PM 12/17/2018    7:07 AM 08/07/2017    9:59 AM 07/24/2017    3:11 PM 07/17/2017    7:10 AM  Advanced Directives  Does Patient Have a Medical Advance Directive? No No No No No No  Would patient like information on creating a medical advance directive? Yes (MAU/Ambulatory/Procedural Areas - Information given) Yes (MAU/Ambulatory/Procedural Areas - Information given) No - Patient declined No - Patient declined No - Patient declined Yes (MAU/Ambulatory/Procedural Areas - Information given)    Current Medications (verified) Outpatient Encounter Medications as of 03/30/2022  Medication Sig   levothyroxine (SYNTHROID) 100 MCG tablet Take 1 tablet (100 mcg total) by mouth daily.   No facility-administered encounter medications on file as of 03/30/2022.    Allergies (verified) Patient has no known allergies.   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[place])    Vertigo    no episodes in over 10 yrs   Past Surgical History:  Procedure Laterality Date   COLONOSCOPY WITH PROPOFOL N/A 07/17/2017   Procedure: COLONOSCOPY WITH PROPOFOL;  Surgeon: Lin Landsman, MD;  Location: [place];  Service: Endoscopy;  Laterality: N/A;   COLONOSCOPY WITH PROPOFOL N/A 12/17/2018   Procedure: COLONOSCOPY WITH PROPOFOL;  Surgeon: Lin Landsman, MD;  Location: [place];  Service: Endoscopy;  Laterality: N/A;   LAPAROSCOPIC ILEOCECECTOMY  09/2017   neuroendocrine tumor   POLYPECTOMY  07/17/2017   Procedure: POLYPECTOMY;  Surgeon: Lin Landsman, MD;  Location: [place];  Service: Endoscopy;;   POLYPECTOMY  12/17/2018   Procedure: POLYPECTOMY;  Surgeon: Lin Landsman, MD;  Location: [place];  Service: Endoscopy;;   TOTAL THYROIDECTOMY  08/2017   no carcinoma   Family History  Problem Relation Age of Onset   Stroke Mother    Colon cancer Father    Prostate cancer Father    Breast cancer Maternal Aunt 70   Liver cancer Maternal Aunt    Stomach cancer Maternal Aunt    Social History   Socioeconomic History   Marital status: Single    Spouse name: Not  on file   Number of children: 1   Years of education: Not on file   Highest education level: Not on file  Occupational History   Occupation: Retired   Tobacco Use   Smoking status: Some Days    Packs/day: 0.25    Years: 15.00    Total pack years: 3.75    Types: Cigarettes    Last attempt to quit: 08/09/2017    Years since quitting: 4.6   Smokeless tobacco: Never  Vaping Use   Vaping Use: Never used  Substance and Sexual Activity   Alcohol use: Yes    Comment: 1-2 drinks/month   Drug use: No   Sexual activity: Never  Other Topics Concern   Not on file  Social History Narrative   Pt  lives alone   Social Determinants of Health   Financial Resource Strain: Low Risk  (03/30/2022)   Overall Financial Resource Strain (CARDIA)    Difficulty of Paying Living Expenses: Not hard at all  Food Insecurity: No Food Insecurity (03/30/2022)   Hunger Vital Sign    Worried About Running Out of Food in the Last Year: Never true    Ran Out of Food in the Last Year: Never true  Transportation Needs: No Transportation Needs (03/30/2022)   PRAPARE - Hydrologist (Medical): No    Lack of Transportation (Non-Medical): No  Physical Activity: Inactive (03/30/2022)   Exercise Vital Sign    Days of Exercise per Week: 0 days    Minutes of Exercise per Session: 0 min  Stress: No Stress Concern Present (03/30/2022)   [place]    Feeling of Stress : Not at all  Social Connections: Moderately Integrated (03/30/2022)   Social Connection and Isolation Panel [NHANES]    Frequency of Communication with Friends and Family: More than three times a week    Frequency of Social Gatherings with Friends and Family: Twice a week    Attends Religious Services: More than 4 times per year    Active Member of [employer] or Organizations: Yes    Attends Music therapist: More than 4 times per year    Marital Status: Never married    Tobacco Counseling Ready to quit: Yes Counseling given: Yes   Clinical Intake:  Pre-visit preparation completed: No  Pain : No/denies pain Pain Score: 0-No pain     Nutritional Status: BMI of 19-24  Normal Nutritional Risks: Unintentional weight loss  How often do you need to have someone help you when you read instructions, pamphlets, or other written materials from your doctor or pharmacy?: 1 - Never  Diabetic?No  Interpreter Needed?: No  Information entered by :: Donnie Mesa, CMA   Activities of Daily Living    03/30/2022    3:06 PM 10/16/2021    9:00 AM   In your present state of health, do you have any difficulty performing the following activities:  Hearing? 0 0  Vision? 0 0  Difficulty concentrating or making decisions? 0 0  Walking or climbing stairs? 0 0  Dressing or bathing? 0 0  Doing errands, shopping? 0 0    Patient Care Team: Glean Hess, MD as PCP - General (Internal Medicine) Lin Landsman, MD as Consulting Physician (Gastroenterology) Gae Dry, MD as Referring Physician (Obstetrics and Gynecology)  Indicate any recent Medical Services you may have received from other than [facility] providers in the past year (date may  be approximate).    The pt was seen at [facility] emergency Dept on 02/20/2022 for acute kidney injury.  Assessment:   This is a routine wellness examination for [place].  Hearing/Vision screen Denies any hearing issues. Denies any vision issues since her cataract surgery.   Dietary issues and exercise activities discussed: Current Exercise Habits: The patient does not participate in regular exercise at present, Exercise limited by: None identified   Goals Addressed   None    Depression Screen    03/30/2022    3:04 PM 03/09/2022    1:43 PM 10/16/2021    8:59 AM 04/17/2021    9:51 AM 03/20/2021    2:13 PM 10/12/2020    8:51 AM 03/16/2020    2:20 PM  PHQ 2/9 Scores  PHQ - 2 Score 0 0 0 0 2 1 0  PHQ- 9 Score  6 6 0 6 6     Fall Risk    03/30/2022    3:05 PM 03/09/2022    1:43 PM 10/16/2021    9:00 AM 04/17/2021    9:52 AM 04/17/2021    9:44 AM  [place] in the past year? 0 0 0 0 0  Number falls in past yr: 0 0 0 0 0  Injury with Fall? 0 0 0 0 0  Risk for fall due to : No Fall Risks No Fall Risks No Fall Risks No Fall Risks No Fall Risks  Follow up Falls evaluation completed Falls evaluation completed Falls evaluation completed Falls evaluation completed Falls evaluation completed    FALL RISK PREVENTION PERTAINING TO THE HOME:  Any stairs in or around the  home? No  If so, are there any without handrails? No  Home free of loose throw rugs in walkways, pet beds, electrical cords, etc? Yes  Adequate lighting in your home to reduce risk of falls? Yes   ASSISTIVE DEVICES UTILIZED TO PREVENT FALLS:  Life alert? No  Use of a cane, walker or w/c? No  Grab bars in the bathroom?  No Shower chair or bench in shower? No  Elevated toilet seat or a handicapped toilet? No   TIMED UP AND GO:  Was the test performed? No, virtual appt. Cognitive Function:        03/30/2022    3:06 PM 03/16/2020    2:24 PM  6CIT Screen  What Year? 0 points 0 points  What month? 0 points 0 points  What time? 0 points 0 points  Count back from 20 0 points 0 points  Months in reverse 0 points 0 points  Repeat phrase 0 points 0 points  Total Score 0 points 0 points    Immunizations Immunization History  Administered Date(s) Administered   Astrazeneca Covid-19 Vaccine, Pf, 0.5 Ml Non [place] 03/05/2022   Influenza, High Dose Seasonal PF 04/02/2018, 02/10/2019, 01/31/2020, 02/28/2021   Influenza-Unspecified 03/05/2022   PFIZER(Purple Top)SARS-COV-2 Vaccination 07/22/2019, 08/12/2019, 02/22/2020, 11/02/2020, 03/02/2022   Pfizer Covid-19 Vaccine Bivalent Booster 67yr & up 02/28/2021   Pneumococcal Conjugate-13 06/14/2017, 04/02/2018   Pneumococcal Polysaccharide-23 10/07/2018, 01/31/2020   Zoster Recombinat (Shingrix) 03/13/2018, 02/10/2019    TDAP status: Due, Education has been provided regarding the importance of this vaccine. Advised may receive this vaccine at local pharmacy or Health Dept. Aware to provide a copy of the vaccination record if obtained from local pharmacy or Health Dept. Verbalized acceptance and understanding.  Flu Vaccine status: Up to date  Pneumococcal vaccine status: Up to date  Covid-19  vaccine status: Completed vaccines  Qualifies for Shingles Vaccine? Yes   Zostavax completed Yes   Shingrix Completed?: Yes  Screening  Tests Health Maintenance  Topic Date Due   TETANUS/TDAP  03/10/2023 (Originally 09/14/1969)   COVID-19 Vaccine (7 - Pfizer risk series) 04/30/2022   MAMMOGRAM  03/28/2023   Medicare Annual Wellness (AWV)  03/31/2023   COLONOSCOPY (Pts 45-74yr Insurance coverage will need to be confirmed)  09/20/2024   Pneumonia Vaccine 70 Years old  Completed   INFLUENZA VACCINE  Completed   DEXA SCAN  Completed   Hepatitis C Screening  Completed   Zoster Vaccines- Shingrix  Completed   HPV VACCINES  Aged Out    Health Maintenance  There are no preventive care reminders to display for this patient.   Colorectal cancer screening: Type of screening: Colonoscopy. Completed 08/31/2021. Repeat every 3 years  Mammogram status: Completed 03/27/2022. Repeat every year  DEXA Scan: 12/01/2020  Lung Cancer Screening: (Low Dose CT Chest recommended if Age 70-80years, 30 pack-year currently smoking OR have quit w/in 15years.) does not qualify.     Additional Screening:  Hepatitis C Screening: does qualify; Completed 10/26/2019  Vision Screening: Recommended annual ophthalmology exams for early detection of glaucoma and other disorders of the eye. Is the patient up to date with their annual eye exam?  Yes  Who is the provider or what is the name of the office in which the patient attends annual eye exams?  If pt is not established with a provider, would they like to be referred to a provider to establish care? No .   Dental Screening: Recommended annual dental exams for proper oral hygiene  Community Resource Referral / Chronic Care Management: CRR required this visit?  No   CCM required this visit?  No      Plan:     I have personally reviewed and noted the following in the patient's chart:   Medical and social history Use of alcohol, tobacco or illicit drugs  Current medications and supplements including opioid prescriptions. Patient is not currently taking opioid  prescriptions. Functional ability and status Nutritional status Physical activity Advanced directives List of other physicians Hospitalizations, surgeries, and ER visits in previous 12 months Vitals Screenings to include cognitive, depression, and falls Referrals and appointments  In addition, I have reviewed and discussed with patient certain preventive protocols, quality metrics, and best practice recommendations. A written personalized care plan for preventive services as well as general preventive health recommendations were provided to patient.    Ms. MJob, Thank you for taking time to come for your Medicare Wellness Visit. I appreciate your ongoing commitment to your health goals. Please review the following plan we discussed and let me know if I can assist you in the future.   These are the goals we discussed:  Goals      Increase physical activity     Recommend increasing physical activity to at least 3 days per week        This is a list of the screening recommended for you and due dates:  Health Maintenance  Topic Date Due   Tetanus Vaccine  03/10/2023*   COVID-19 Vaccine (7 - Pfizer risk series) 04/30/2022   Mammogram  03/28/2023   Medicare Annual Wellness Visit  03/31/2023   Colon Cancer Screening  09/20/2024   Pneumonia Vaccine  Completed   Flu Shot  Completed   DEXA scan (bone density measurement)  Completed   Hepatitis C Screening: USPSTF  Recommendation to screen - Ages 41-79 yo.  Completed   Zoster (Shingles) Vaccine  Completed   HPV Vaccine  Aged Out  *Topic was postponed. The date shown is not the original due date.     Wilson Singer, [place]   03/30/2022   Nurse Notes: Approximately 30 minute Non-Face-To-Face Visit

## 2022-03-30 NOTE — Patient Instructions (Signed)

## 2022-05-25 ENCOUNTER — Encounter: Payer: Self-pay | Admitting: Internal Medicine

## 2022-05-25 ENCOUNTER — Ambulatory Visit (INDEPENDENT_AMBULATORY_CARE_PROVIDER_SITE_OTHER): Payer: Medicare HMO | Admitting: Internal Medicine

## 2022-05-25 VITALS — BP 124/76 | HR 60 | Temp 98.0°F | Ht 62.5 in | Wt 160.0 lb

## 2022-05-25 DIAGNOSIS — R6889 Other general symptoms and signs: Secondary | ICD-10-CM

## 2022-05-25 DIAGNOSIS — R509 Fever, unspecified: Secondary | ICD-10-CM

## 2022-05-25 DIAGNOSIS — R519 Headache, unspecified: Secondary | ICD-10-CM

## 2022-05-25 DIAGNOSIS — R059 Cough, unspecified: Secondary | ICD-10-CM | POA: Diagnosis not present

## 2022-05-25 LAB — POC COVID19 BINAXNOW: SARS Coronavirus 2 Ag: NEGATIVE

## 2022-05-25 NOTE — Progress Notes (Signed)
Date:  05/25/2022   Name:  Kristin Escobar   DOB:  1950/08/24   MRN:  [id number]   Chief Complaint: Cough  Influenza This is a new problem. Episode onset: three days ago. The problem has been unchanged. Associated symptoms include chills, congestion, coughing, a fever (low grade at night) and headaches. Pertinent negatives include no abdominal pain, chest pain, nausea, sore throat or vomiting.    Lab Results  Component Value Date   NA 139 10/16/2021   K 4.4 10/16/2021   CO2 23 10/16/2021   GLUCOSE 100 (H) 10/16/2021   BUN 16 10/16/2021   CREATININE 0.93 10/16/2021   CALCIUM 9.3 10/16/2021   EGFR 66 10/16/2021   GFRNONAA 57 (L) 10/08/2019   Lab Results  Component Value Date   CHOL 197 10/16/2021   HDL 54 10/16/2021   LDLCALC 113 (H) 10/16/2021   TRIG 173 (H) 10/16/2021   CHOLHDL 3.6 10/16/2021   Lab Results  Component Value Date   TSH 5.400 (H) 10/16/2021   Lab Results  Component Value Date   HGBA1C 5.6 10/16/2021   Lab Results  Component Value Date   WBC 6.4 10/16/2021   HGB 13.8 10/16/2021   HCT 41.1 10/16/2021   MCV 95 10/16/2021   PLT 239 10/16/2021   Lab Results  Component Value Date   ALT 16 10/16/2021   AST 19 10/16/2021   ALKPHOS 95 10/16/2021   BILITOT 0.4 10/16/2021   Lab Results  Component Value Date   VD25OH 20.9 (L) 10/12/2020     Review of Systems  Constitutional:  Positive for chills and fever (low grade at night).  HENT:  Positive for congestion. Negative for sinus pressure, sore throat and trouble swallowing.   Respiratory:  Positive for cough. Negative for shortness of breath and wheezing.   Cardiovascular:  Negative for chest pain and palpitations.  Gastrointestinal:  Negative for abdominal pain, diarrhea, nausea and vomiting.  Neurological:  Positive for headaches.  Psychiatric/Behavioral:  Negative for sleep disturbance.     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   Tobacco use disorder 07/12/2017   Vitamin D deficiency 06/14/2017   Prediabetes 06/14/2017    No Known Allergies  Past Surgical History:  Procedure Laterality Date   COLONOSCOPY WITH PROPOFOL N/A 07/17/2017   Procedure: COLONOSCOPY WITH PROPOFOL;  Surgeon: Lin Landsman, MD;  Location: [place];  Service: Endoscopy;  Laterality: N/A;   COLONOSCOPY WITH PROPOFOL N/A 12/17/2018   Procedure: COLONOSCOPY WITH PROPOFOL;  Surgeon: Lin Landsman, MD;  Location: [place];  Service: Endoscopy;  Laterality: N/A;   LAPAROSCOPIC ILEOCECECTOMY  09/2017   neuroendocrine tumor   POLYPECTOMY  07/17/2017   Procedure: POLYPECTOMY;  Surgeon: Lin Landsman, MD;  Location: [place];  Service: Endoscopy;;   POLYPECTOMY  12/17/2018   Procedure: POLYPECTOMY;  Surgeon: Lin Landsman, MD;  Location: [place];  Service: Endoscopy;;   TOTAL THYROIDECTOMY  08/2017   no carcinoma    Social History   Tobacco Use   Smoking status: Some Days    Packs/day: 0.25    Years: 15.00    Total pack years: 3.75    Types: Cigarettes    Last attempt to quit: 08/09/2017    Years since quitting: 4.7   Smokeless tobacco: Never  Vaping  Use   Vaping Use: Never used  Substance Use Topics   Alcohol use: Yes    Comment: 1-2 drinks/month   Drug use: No     Medication list has been reviewed and updated.  Current Meds  Medication Sig   levothyroxine (SYNTHROID) 100 MCG tablet Take 1 tablet (100 mcg total) by mouth daily.       03/09/2022    1:43 PM 10/16/2021    8:59 AM 04/17/2021    9:52 AM 04/17/2021    9:44 AM  GAD 7 : Generalized Anxiety Score  Nervous, Anxious, on Edge 0 0 0 0  Control/stop worrying 0 2 3 0  Worry too much - different things 0 2 3 0  Trouble relaxing 0 0 0 0  Restless 0 0 0 0  Easily annoyed or  irritable 0 0 0 0  Afraid - awful might happen 0 0 0 0  Total GAD 7 Score 0 4 6 0  Anxiety Difficulty Not difficult at all Not difficult at all Not difficult at all Not difficult at all       03/30/2022    3:04 PM 03/09/2022    1:43 PM 10/16/2021    8:59 AM  Depression screen PHQ 2/9  Decreased Interest 0 0 0  Down, Depressed, Hopeless 0 0 0  PHQ - 2 Score 0 0 0  Altered sleeping  0 2  Tired, decreased energy  3 2  Change in appetite  3 2  Feeling bad or failure about yourself   0 0  Trouble concentrating  0 0  Moving slowly or fidgety/restless  0 0  Suicidal thoughts  0 0  PHQ-9 Score  6 6  Difficult doing work/chores  Not difficult at all Not difficult at all    BP Readings from Last 3 Encounters:  05/25/22 124/76  03/09/22 100/62  10/16/21 104/60    Physical Exam Vitals and nursing note reviewed.  Constitutional:      General: She is not in acute distress.    Appearance: Normal appearance. She is well-developed.  HENT:     Head: Normocephalic and atraumatic.     Right Ear: Tympanic membrane normal.     Left Ear: Tympanic membrane normal.     Nose:     Right Sinus: No maxillary sinus tenderness or frontal sinus tenderness.     Left Sinus: No maxillary sinus tenderness or frontal sinus tenderness.     Mouth/Throat:     Pharynx: No oropharyngeal exudate or posterior oropharyngeal erythema.  Cardiovascular:     Rate and Rhythm: Normal rate and regular rhythm.  Pulmonary:     Effort: Pulmonary effort is normal. No respiratory distress.     Breath sounds: No wheezing or rhonchi.  Musculoskeletal:     Cervical back: Normal range of motion.  Lymphadenopathy:     Cervical: No cervical adenopathy.  Skin:    General: Skin is warm and dry.     Findings: No rash.  Neurological:     Mental Status: She is alert and oriented to person, place, and time.  Psychiatric:        Mood and Affect: Mood normal.        Behavior: Behavior normal.     Wt Readings from Last 3  Encounters:  05/25/22 160 lb (72.6 kg)  03/09/22 157 lb (71.2 kg)  10/16/21 161 lb (73 kg)    BP 124/76   Pulse 60   Temp 98 F (36.7 C) (Oral)  Ht 5' 2.5" (1.588 m)   Wt 160 lb (72.6 kg)   SpO2 95%   BMI 28.80 kg/m   Assessment and Plan: Problem List Items Addressed This Visit   None Visit Diagnoses     Flu-like symptoms    -  Primary   Continue Tylenol every 6-8 hours  Push fluids; continue Coricidin HBP Use Delsym cough syrup if needed   Relevant Orders   POC COVID-19 BinaxNow (Completed)        Partially dictated using Editor, commissioning. Any errors are unintentional.  Halina Maidens, MD [facility]  05/25/2022

## 2022-07-13 DIAGNOSIS — Z961 Presence of intraocular lens: Secondary | ICD-10-CM | POA: Diagnosis not present

## 2022-07-13 DIAGNOSIS — H43812 Vitreous degeneration, left eye: Secondary | ICD-10-CM | POA: Diagnosis not present

## 2022-07-13 DIAGNOSIS — H04123 Dry eye syndrome of bilateral lacrimal glands: Secondary | ICD-10-CM | POA: Diagnosis not present

## 2022-07-13 DIAGNOSIS — H35033 Hypertensive retinopathy, bilateral: Secondary | ICD-10-CM | POA: Diagnosis not present

## 2022-10-04 DIAGNOSIS — F17210 Nicotine dependence, cigarettes, uncomplicated: Secondary | ICD-10-CM | POA: Diagnosis not present

## 2022-10-04 DIAGNOSIS — R1084 Generalized abdominal pain: Secondary | ICD-10-CM | POA: Diagnosis not present

## 2022-10-04 DIAGNOSIS — R110 Nausea: Secondary | ICD-10-CM | POA: Diagnosis not present

## 2022-10-04 DIAGNOSIS — E079 Disorder of thyroid, unspecified: Secondary | ICD-10-CM | POA: Diagnosis not present

## 2022-10-04 DIAGNOSIS — Z79899 Other long term (current) drug therapy: Secondary | ICD-10-CM | POA: Diagnosis not present

## 2022-10-04 DIAGNOSIS — R197 Diarrhea, unspecified: Secondary | ICD-10-CM | POA: Diagnosis not present

## 2022-10-04 DIAGNOSIS — Z79890 Hormone replacement therapy (postmenopausal): Secondary | ICD-10-CM | POA: Diagnosis not present

## 2022-10-04 DIAGNOSIS — N3090 Cystitis, unspecified without hematuria: Secondary | ICD-10-CM | POA: Diagnosis not present

## 2022-10-04 LAB — COMPREHENSIVE METABOLIC PANEL
Calcium: 9 (ref 8.7–10.7)
eGFR: 69

## 2022-10-04 LAB — BASIC METABOLIC PANEL
BUN: 17 (ref 4–21)
CO2: 27 — AB (ref 13–22)
Chloride: 109 — AB (ref 99–108)
Creatinine: 0.9 (ref 0.5–1.1)
Glucose: 86
Potassium: 3.8 mEq/L (ref 3.5–5.1)
Sodium: 143 (ref 137–147)

## 2022-10-04 LAB — HEPATIC FUNCTION PANEL
ALT: 19 U/L (ref 7–35)
AST: 19 (ref 13–35)
Alkaline Phosphatase: 87 (ref 25–125)

## 2022-10-04 LAB — CBC AND DIFFERENTIAL
HCT: 40 (ref 36–46)
Hemoglobin: 13.9 (ref 12.0–16.0)
Platelets: 239 10*3/uL (ref 150–400)
WBC: 6.8

## 2022-10-09 ENCOUNTER — Telehealth: Payer: Self-pay

## 2022-10-09 DIAGNOSIS — K5792 Diverticulitis of intestine, part unspecified, without perforation or abscess without bleeding: Secondary | ICD-10-CM | POA: Diagnosis not present

## 2022-10-09 DIAGNOSIS — R197 Diarrhea, unspecified: Secondary | ICD-10-CM | POA: Diagnosis not present

## 2022-10-09 DIAGNOSIS — C7A8 Other malignant neuroendocrine tumors: Secondary | ICD-10-CM | POA: Diagnosis not present

## 2022-10-09 NOTE — Transitions of Care (Post Inpatient/ED Visit) (Unsigned)
   10/09/2022  Name: Kristin Escobar MRN: [id number] DOB: 1950/10/15  Today's TOC FU Call Status: Today's TOC FU Call Status:: Unsuccessul Call (1st Attempt) Unsuccessful Call (1st Attempt) Date: 10/09/22  Attempted to reach the patient regarding the most recent Inpatient/ED visit.  Follow Up Plan: Additional outreach attempts will be made to reach the patient to complete the Transitions of Care (Post Inpatient/ED visit) call.   Micki Cassel [facility]  Primary Care & Sports Medicine at [facility], [facility] [address]  [place] [address] Office [phone]  Fax: [phone]

## 2022-10-09 NOTE — Transitions of Care (Post Inpatient/ED Visit) (Unsigned)
   10/09/2022  Name: Kristin Escobar MRN: [id number] DOB: 1950-08-11  Today's TOC FU Call Status: Today's TOC FU Call Status:: Unsuccessful Call (2nd Attempt) Unsuccessful Call (1st Attempt) Date: 10/09/22 Unsuccessful Call (2nd Attempt) Date: 10/10/22  Attempted to reach the patient regarding the most recent Inpatient/ED visit.  Follow Up Plan: Additional outreach attempts will be made to reach the patient to complete the Transitions of Care (Post Inpatient/ED visit) call.   Draylon Mercadel [facility]  Primary Care & Sports Medicine at [facility], [facility] [address]  [place] [address] Office [phone]  Fax: [phone]

## 2022-10-10 NOTE — Transitions of Care (Post Inpatient/ED Visit) (Signed)
   10/10/2022  Name: Kristin Escobar MRN: [id number] DOB: [birth year]  Today's TOC FU Call Status: Today's TOC FU Call Status:: Unsuccessful Call (3rd Attempt) Unsuccessful Call (1st Attempt) Date: 10/09/22 Unsuccessful Call (2nd Attempt) Date: 10/10/22 Unsuccessful Call (3rd Attempt) Date: 10/10/22  Attempted to reach the patient regarding the most recent Inpatient/ED visit.  Follow Up Plan: No further outreach attempts will be made at this time. We have been unable to contact the patient.  [name]  Primary Care & Sports Medicine at [facility], [facility] [address]  [place] [address] Office [phone]  Fax: [phone]

## 2022-10-16 ENCOUNTER — Other Ambulatory Visit: Payer: Self-pay | Admitting: Internal Medicine

## 2022-10-16 DIAGNOSIS — E890 Postprocedural hypothyroidism: Secondary | ICD-10-CM

## 2022-10-16 NOTE — Telephone Encounter (Signed)
Requested medication (s) are due for refill today: Due 10/18/22  Requested medication (s) are on the active medication list: yes    Last refill: 10/17/21  #90  3 refills  Future visit scheduled yes 10/19/22  Notes to clinic: Labs will be due 10/17/22. Pt has appt 10/19/22. Please review, thank you  Requested Prescriptions  Pending Prescriptions Disp Refills   levothyroxine (SYNTHROID) 100 MCG tablet 90 tablet 3    Sig: Take 1 tablet (100 mcg total) by mouth daily.     Endocrinology:  Hypothyroid Agents Failed - 10/16/2022  8:24 AM      Failed - TSH in normal range and within 360 days    TSH  Date Value Ref Range Status  10/16/2021 5.400 (H) 0.450 - 4.500 uIU/mL Final         Passed - Valid encounter within last 12 months    Recent Outpatient Visits           4 months ago Flu-like symptoms   [place] [facility] Medicine at [facility], Nyoka Cowden, MD   7 months ago Acute non-recurrent maxillary sinusitis   [place] Primary Care & Sports Medicine at [facility], Nyoka Cowden, MD   1 year ago Annual physical exam   [facility] Primary Care & Sports Medicine at [facility], Nyoka Cowden, MD   1 year ago Postoperative hypothyroidism   [place] Primary Care & Sports Medicine at [facility], Nyoka Cowden, MD   2 years ago Annual physical exam   [facility] Primary Care & Sports Medicine at [facility], Nyoka Cowden, MD       Future Appointments             In 3 days Judithann Graves Nyoka Cowden, MD [facility] Primary Care & Sports Medicine at [facility], [facility]

## 2022-10-16 NOTE — Telephone Encounter (Signed)
Medication Refill - Medication: levothyroxine (SYNTHROID) 100 MCG tablet   Has the patient contacted their pharmacy? No. No, more refills.   (Agent: If no, request that the patient contact the pharmacy for the refill. If patient does not wish to contact the pharmacy document the reason why and proceed with request.)   Preferred Pharmacy (with phone number or street name):  [facility] [address]), [place] - [address]  [address]) [place] [address]  Phone: [phone] Fax: [phone]  Hours: Not open 24 hours   Has the patient been seen for an appointment in the last year OR does the patient have an upcoming appointment? Yes.    Agent: Please be advised that RX refills may take up to 3 business days. We ask that you follow-up with your pharmacy.

## 2022-10-18 MED ORDER — LEVOTHYROXINE SODIUM 100 MCG PO TABS
100.0000 ug | ORAL_TABLET | Freq: Every day | ORAL | 0 refills | Status: DC
Start: 2022-10-18 — End: 2023-01-11

## 2022-10-19 ENCOUNTER — Encounter: Payer: Self-pay | Admitting: Internal Medicine

## 2022-10-19 ENCOUNTER — Ambulatory Visit (INDEPENDENT_AMBULATORY_CARE_PROVIDER_SITE_OTHER): Payer: Medicare HMO | Admitting: Internal Medicine

## 2022-10-19 VITALS — BP 114/64 | HR 77 | Ht 62.5 in | Wt 159.0 lb

## 2022-10-19 DIAGNOSIS — R7303 Prediabetes: Secondary | ICD-10-CM | POA: Diagnosis not present

## 2022-10-19 DIAGNOSIS — C7A8 Other malignant neuroendocrine tumors: Secondary | ICD-10-CM | POA: Diagnosis not present

## 2022-10-19 DIAGNOSIS — Z1231 Encounter for screening mammogram for malignant neoplasm of breast: Secondary | ICD-10-CM | POA: Diagnosis not present

## 2022-10-19 DIAGNOSIS — E890 Postprocedural hypothyroidism: Secondary | ICD-10-CM | POA: Diagnosis not present

## 2022-10-19 DIAGNOSIS — E559 Vitamin D deficiency, unspecified: Secondary | ICD-10-CM | POA: Diagnosis not present

## 2022-10-19 DIAGNOSIS — K529 Noninfective gastroenteritis and colitis, unspecified: Secondary | ICD-10-CM | POA: Diagnosis not present

## 2022-10-19 DIAGNOSIS — E782 Mixed hyperlipidemia: Secondary | ICD-10-CM | POA: Diagnosis not present

## 2022-10-19 DIAGNOSIS — Z0000 Encounter for general adult medical examination without abnormal findings: Secondary | ICD-10-CM

## 2022-10-19 MED ORDER — OMEPRAZOLE 20 MG PO CPDR
20.0000 mg | DELAYED_RELEASE_CAPSULE | Freq: Two times a day (BID) | ORAL | 0 refills | Status: DC
Start: 2022-10-19 — End: 2023-08-09

## 2022-10-19 NOTE — Progress Notes (Signed)
Date:  10/19/2022   Name:  Kristin Escobar   DOB:  Aug 10, 1950   MRN:  [id number]   Chief Complaint: Annual Exam POETRY SIS is a 72 y.o. female who presents today for her Complete Annual Exam. She feels fairly well. She reports exercising. She reports she is sleeping fairly well. Breast complaints - none.  Mammogram: 02/2022 DEXA: 11/2020 Colonoscopy: 08/2021 repeat 3 yrs  Health Maintenance Due  Topic Date Due   DTaP/Tdap/Td (1 - Tdap) Never done   COVID-19 Vaccine (7 - 2023-24 season) 04/30/2022    Immunization History  Administered Date(s) Administered    Astrazeneca Covid-19 Vaccine, Pf, 0.5 Ml Non [place]  03/05/2022   Influenza, High Dose Seasonal PF 04/02/2018, 02/10/2019, 01/31/2020, 02/28/2021   Influenza-Unspecified 02/26/2022   PFIZER(Purple Top)SARS-COV-2 Vaccination 07/22/2019, 08/12/2019, 02/22/2020, 11/02/2020, 03/02/2022   Pfizer Covid-19 Vaccine Bivalent Booster 39yrs & up 02/28/2021   Pneumococcal Conjugate-13 06/14/2017, 04/02/2018   Pneumococcal Polysaccharide-23 10/07/2018, 01/31/2020   Respiratory Syncytial Virus Vaccine,Recomb Aduvanted(Arexvy) 02/26/2022   Zoster Recombinat (Shingrix) 03/13/2018, 02/10/2019    Gastroesophageal Reflux She complains of heartburn and water brash. She reports no abdominal pain, no chest pain, no coughing or no wheezing. This is a recurrent problem. The heartburn is located in the substernum. The heartburn does not wake her from sleep. The heartburn does not limit her activity. The heartburn changes with position. Pertinent negatives include no fatigue. She has tried an antacid for the symptoms. The treatment provided moderate relief.  Thyroid Problem Presents for follow-up visit. Patient reports no anxiety, constipation, diarrhea, fatigue, palpitations or tremors. The symptoms have been stable.    Lab Results  Component Value Date   NA 143 10/04/2022   K 3.8 10/04/2022   CO2 27 (A) 10/04/2022   GLUCOSE 100 (H)  10/16/2021   BUN 17 10/04/2022   CREATININE 0.9 10/04/2022   CALCIUM 9.0 10/04/2022   EGFR 69 10/04/2022   GFRNONAA 57 (L) 10/08/2019   Lab Results  Component Value Date   CHOL 197 10/16/2021   HDL 54 10/16/2021   LDLCALC 113 (H) 10/16/2021   TRIG 173 (H) 10/16/2021   CHOLHDL 3.6 10/16/2021   Lab Results  Component Value Date   TSH 5.400 (H) 10/16/2021   Lab Results  Component Value Date   HGBA1C 5.6 10/16/2021   Lab Results  Component Value Date   WBC 6.8 10/04/2022   HGB 13.9 10/04/2022   HCT 40 10/04/2022   MCV 95 10/16/2021   PLT 239 10/04/2022   Lab Results  Component Value Date   ALT 19 10/04/2022   AST 19 10/04/2022   ALKPHOS 87 10/04/2022   BILITOT 0.4 10/16/2021   Lab Results  Component Value Date   VD25OH 20.9 (L) 10/12/2020     Review of Systems  Constitutional:  Negative for chills, fatigue and fever.  HENT:  Negative for congestion, hearing loss, tinnitus, trouble swallowing and voice change.   Eyes:  Negative for visual disturbance.  Respiratory:  Negative for cough, chest tightness, shortness of breath and wheezing.   Cardiovascular:  Negative for chest pain, palpitations and leg swelling.  Gastrointestinal:  Positive for heartburn. Negative for abdominal pain, constipation, diarrhea and vomiting.  Endocrine: Negative for polydipsia and polyuria.  Genitourinary:  Negative for dysuria, frequency, genital sores, vaginal bleeding and vaginal discharge.  Musculoskeletal:  Negative for arthralgias, gait problem and joint swelling.  Skin:  Negative for color change and rash.  Neurological:  Negative for dizziness, tremors, light-headedness  and headaches.  Hematological:  Negative for adenopathy. Does not bruise/bleed easily.  Psychiatric/Behavioral:  Negative for dysphoric mood and sleep disturbance. The patient is not nervous/anxious.     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   Tobacco use disorder 07/12/2017   Vitamin D deficiency 06/14/2017   Prediabetes 06/14/2017    No Known Allergies  Past Surgical History:  Procedure Laterality Date   COLONOSCOPY WITH PROPOFOL N/A 07/17/2017   Procedure: COLONOSCOPY WITH PROPOFOL;  Surgeon: Toney Reil, MD;  Location[facility];  Service: Endoscopy;  Laterality: N/A;   COLONOSCOPY WITH PROPOFOL N/A 12/17/2018   Procedure: COLONOSCOPY WITH PROPOFOL;  Surgeon: Toney Reil, MD;  Location: [facility];  Service: Endoscopy;  Laterality: N/A;   LAPAROSCOPIC ILEOCECECTOMY  09/2017   neuroendocrine tumor   POLYPECTOMY  07/17/2017   Procedure: POLYPECTOMY;  Surgeon: Toney Reil, MD;  Location: [facility];  Service: Endoscopy;;   POLYPECTOMY  12/17/2018   Procedure: POLYPECTOMY;  Surgeon: Toney Reil, MD;  Location: [facility];  Service: Endoscopy;;   TOTAL THYROIDECTOMY  08/2017   no carcinoma    Social History   Tobacco Use   Smoking status: Every Day    Packs/day: 0.10    Years: 20.00    Additional pack years: 0.00    Total pack years: 2.00    Types: Cigarettes   Smokeless tobacco: Never   Tobacco comments:    Smokes 2 -4 cigarettes a day  Vaping Use   Vaping Use: Never used  Substance Use Topics   Alcohol use: Yes    Comment: 1-2 drinks/month   Drug use: No     Medication list has been reviewed and updated.  Current Meds  Medication Sig   cholestyramine (QUESTRAN) 4 g packet Take 1 packet by mouth daily.   levothyroxine (SYNTHROID) 100 MCG tablet Take 1 tablet (100 mcg total) by mouth daily.   omeprazole (PRILOSEC) 20 MG capsule Take 1 capsule (20 mg total) by mouth 2 (two) times daily.   [DISCONTINUED] Cholecalciferol 125 MCG (5000 UT) capsule Take 5,000 Units by mouth daily.       10/19/2022    8:18 AM  03/09/2022    1:43 PM 10/16/2021    8:59 AM 04/17/2021    9:52 AM  GAD 7 : Generalized Anxiety Score  Nervous, Anxious, on Edge 0 0 0 0  Control/stop worrying 1 0 2 3  Worry too much - different things 1 0 2 3  Trouble relaxing 0 0 0 0  Restless 0 0 0 0  Easily annoyed or irritable 0 0 0 0  Afraid - awful might happen 0 0 0 0  Total GAD 7 Score 2 0 4 6  Anxiety Difficulty Not difficult at all Not difficult at all Not difficult at all Not difficult at all       10/19/2022    8:18 AM 03/30/2022    3:04 PM 03/09/2022    1:43 PM  Depression screen PHQ 2/9  Decreased Interest 1 0 0  Down, Depressed, Hopeless 0 0 0  PHQ - 2 Score 1 0 0  Altered sleeping 2  0  Tired, decreased energy 2  3  Change in appetite 2  3  Feeling bad or failure about yourself  0  0  Trouble concentrating 0  0  Moving slowly or fidgety/restless 0  0  Suicidal thoughts 0  0  PHQ-9 Score 7  6  Difficult doing work/chores Not difficult at all  Not difficult at all    BP Readings from Last 3 Encounters:  10/19/22 114/64  05/25/22 124/76  03/09/22 100/62    Physical Exam Vitals and nursing note reviewed.  Constitutional:      General: She is not in acute distress.    Appearance: She is well-developed.  HENT:     Head: Normocephalic and atraumatic.     Right Ear: Tympanic membrane and ear canal normal.     Left Ear: Tympanic membrane and ear canal normal.     Nose:     Right Sinus: No maxillary sinus tenderness.     Left Sinus: No maxillary sinus tenderness.  Eyes:     General: No scleral icterus.       Right eye: No discharge.        Left eye: No discharge.     Conjunctiva/sclera: Conjunctivae normal.  Neck:     Thyroid: No thyromegaly.     Vascular: No carotid bruit.  Cardiovascular:     Rate and Rhythm: Normal rate and regular rhythm.     Pulses: Normal pulses.     Heart sounds: Normal heart sounds.  Pulmonary:     Effort: Pulmonary effort is normal. No respiratory distress.     Breath  sounds: No wheezing.  Chest:  Breasts:    Right: No mass, nipple discharge, skin change or tenderness.     Left: No mass, nipple discharge, skin change or tenderness.  Abdominal:     General: Bowel sounds are normal.     Palpations: Abdomen is soft.     Tenderness: There is no abdominal tenderness.  Musculoskeletal:     Cervical back: Normal range of motion. No erythema.     Right lower leg: No edema.     Left lower leg: No edema.  Lymphadenopathy:     Cervical: No cervical adenopathy.  Skin:    General: Skin is warm and dry.     Capillary Refill: Capillary refill takes less than 2 seconds.     Findings: No rash.  Neurological:     General: No focal deficit present.     Mental Status: She is alert and oriented to person, place, and time.     Cranial Nerves: No cranial nerve deficit.     Sensory: No sensory deficit.     Deep Tendon Reflexes: Reflexes are normal and symmetric.  Psychiatric:        Attention and Perception: Attention normal.        Mood and Affect: Mood normal.     Wt Readings from Last 3 Encounters:  10/19/22 159 lb (72.1 kg)  05/25/22 160 lb (72.6 kg)  03/09/22 157 lb (71.2 kg)    BP 114/64   Pulse 77   Ht 5' 2.5" (1.588 m)   Wt 159 lb (72.1 kg)   SpO2 98%   BMI 28.62 kg/m   Assessment and Plan:  Problem List Items Addressed This Visit     Vitamin D deficiency (Chronic)    Resume daily supplement 5000 IU      Relevant Orders   VITAMIN D 25 Hydroxy (Vit-D Deficiency, Fractures)   Prediabetes (Chronic)    On low carb diet and exercise Lab Results  Component Value Date   HGBA1C 5.6 10/16/2021  Relevant Orders   Hemoglobin A1c   Postoperative hypothyroidism (Chronic)    supplemented      Relevant Orders   TSH + free T4   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 Colonoscopy done in 2023.      Mixed hyperlipidemia (Chronic)    Cholesterol controlled with diet alone. Lab Results   Component Value Date   LDLCALC 113 (H) 10/16/2021        Relevant Medications   cholestyramine (QUESTRAN) 4 g packet   Other Relevant Orders   Lipid panel   Other Visit Diagnoses     Annual physical exam    -  Primary   up to date on screenings and immunizations   Encounter for screening mammogram for breast cancer       Relevant Orders   MM 3D SCREENING MAMMOGRAM BILATERAL BREAST   Gastroenteritis       Relevant Medications   omeprazole (PRILOSEC) 20 MG capsule   Neuroendocrine carcinoma (HCC)   (Chronic)         No follow-ups on file.   Partially dictated using Dragon software, any errors are not intentional.  Reubin Milan, MD [facility] [place], [place]

## 2022-10-19 NOTE — Assessment & Plan Note (Signed)
Recent labs normal. Colonoscopy done in 2023.

## 2022-10-19 NOTE — Assessment & Plan Note (Signed)
On low carb diet and exercise Lab Results  Component Value Date   HGBA1C 5.6 10/16/2021

## 2022-10-19 NOTE — Assessment & Plan Note (Signed)
supplemented

## 2022-10-19 NOTE — Patient Instructions (Signed)
Call [facility] to schedule your mammogram at [phone].  

## 2022-10-19 NOTE — Assessment & Plan Note (Signed)
Cholesterol controlled with diet alone. Lab Results  Component Value Date   LDLCALC 113 (H) 10/16/2021

## 2022-10-19 NOTE — Assessment & Plan Note (Addendum)
Resume daily supplement 5000 IU

## 2022-10-20 LAB — LIPID PANEL
Chol/HDL Ratio: 3.7 ratio (ref 0.0–4.4)
Cholesterol, Total: 194 mg/dL (ref 100–199)
HDL: 52 mg/dL (ref >39)
LDL Chol Calc (NIH): 112 mg/dL — ABNORMAL HIGH (ref 0–99)
Triglycerides: 170 mg/dL — ABNORMAL HIGH (ref 0–149)
VLDL Cholesterol Cal: 30 mg/dL (ref 5–40)

## 2022-10-20 LAB — HEMOGLOBIN A1C
Est. average glucose Bld gHb Est-mCnc: 123 mg/dL
Hgb A1c MFr Bld: 5.9 % — ABNORMAL HIGH (ref 4.8–5.6)

## 2022-10-20 LAB — TSH+FREE T4
Free T4: 1.42 ng/dL (ref 0.82–1.77)
TSH: 1.76 u[IU]/mL (ref 0.450–4.500)

## 2022-10-20 LAB — VITAMIN D 25 HYDROXY (VIT D DEFICIENCY, FRACTURES): Vit D, 25-Hydroxy: 18.1 ng/mL — ABNORMAL LOW (ref 30.0–100.0)

## 2022-10-23 ENCOUNTER — Other Ambulatory Visit: Payer: Self-pay | Admitting: Internal Medicine

## 2022-10-23 DIAGNOSIS — E559 Vitamin D deficiency, unspecified: Secondary | ICD-10-CM

## 2022-10-23 MED ORDER — VITAMIN D (ERGOCALCIFEROL) 1.25 MG (50000 UNIT) PO CAPS: 50000.0000 [IU] | ORAL_CAPSULE | ORAL | 3 refills | Status: DC

## 2022-11-22 DIAGNOSIS — C7A8 Other malignant neuroendocrine tumors: Secondary | ICD-10-CM | POA: Diagnosis not present

## 2022-12-20 ENCOUNTER — Ambulatory Visit: Payer: Self-pay | Admitting: *Deleted

## 2022-12-20 NOTE — Telephone Encounter (Signed)
Please review. Pt declined appointment.  KP

## 2022-12-20 NOTE — Telephone Encounter (Signed)
  Chief Complaint: low abdominal pain at times. Reports flare up of diverticulitis , requesting medication Symptoms: Sunday c/o low abdominal pain, pain with urination and having BMs.  Frequency: Sunday  Pertinent Negatives: Patient denies fever, no c/o or report of vomiting no diarrhea, no constant pain. Disposition: [] ED /[] Urgent Care (no appt availability in office) / [] Appointment(In office/virtual)/ []  [place] [facility]/ [] Home Care/ [x] Refused Recommended Disposition /[] [facility]/ []  Follow-up with PCP Additional Notes:   Offered appt tomorrow. Patient declined and reports she was last seen 10/19/22 for same issues and just needs medication and PCP is aware. Please advise.     Reason for Disposition  [1] MILD pain (e.g., does not interfere with normal activities) AND [2] pain comes and goes (cramps) AND [3] present > 48 hours  (Exception: This same abdominal pain is a chronic symptom recurrent or ongoing AND present > 4 weeks.)  Answer Assessment - Initial Assessment Questions 1. LOCATION: "Where does it hurt?"      Low abdominal area 2. RADIATION: "Does the pain shoot anywhere else?" (e.g., chest, back)     Na  3. ONSET: "When did the pain begin?" (e.g., minutes, hours or days ago)      Sunday  4. SUDDEN: "Gradual or sudden onset?"     Na  5. PATTERN "Does the pain come and go, or is it constant?"    - If it comes and goes: "How long does it last?" "Do you have pain now?"     (Note: Comes and goes means the pain is intermittent. It goes away completely between bouts.)    - If constant: "Is it getting better, staying the same, or getting worse?"      (Note: Constant means the pain never goes away completely; most serious pain is constant and gets worse.)      Comes and goes  6. SEVERITY: "How bad is the pain?"  (e.g., Scale 1-10; mild, moderate, or severe)    - MILD (1-3): Doesn't interfere with normal activities, abdomen soft and not tender to touch.     -  MODERATE (4-7): Interferes with normal activities or awakens from sleep, abdomen tender to touch.     - SEVERE (8-10): Excruciating pain, doubled over, unable to do any normal activities.       Moderate . Difficulty sleeping . Pain with BM and urinating  7. RECURRENT SYMPTOM: "Have you ever had this type of stomach pain before?" If Yes, ask: "When was the last time?" and "What happened that time?"      Yes  8. CAUSE: "What do you think is causing the stomach pain?"     Diverticulitis flare up  9. RELIEVING/AGGRAVATING FACTORS: "What makes it better or worse?" (e.g., antacids, bending or twisting motion, bowel movement)     na 10. OTHER SYMPTOMS: "Do you have any other symptoms?" (e.g., back pain, diarrhea, fever, urination pain, vomiting)       Urination pain , pain having BMs at times. Low abdominal pain  11. PREGNANCY: "Is there any chance you are pregnant?" "When was your last menstrual period?"       na  Protocols used: Abdominal Pain - [facility]

## 2022-12-21 NOTE — Telephone Encounter (Signed)
Tried calling pt but could not reach to give information. Patient may need to go to UC. We cannot prescribe meds without seeing her.

## 2022-12-26 DIAGNOSIS — S39011A Strain of muscle, fascia and tendon of abdomen, initial encounter: Secondary | ICD-10-CM | POA: Diagnosis not present

## 2022-12-26 DIAGNOSIS — R109 Unspecified abdominal pain: Secondary | ICD-10-CM | POA: Diagnosis not present

## 2022-12-26 DIAGNOSIS — F17210 Nicotine dependence, cigarettes, uncomplicated: Secondary | ICD-10-CM | POA: Diagnosis not present

## 2022-12-26 DIAGNOSIS — S39012A Strain of muscle, fascia and tendon of lower back, initial encounter: Secondary | ICD-10-CM | POA: Diagnosis not present

## 2022-12-26 DIAGNOSIS — M549 Dorsalgia, unspecified: Secondary | ICD-10-CM | POA: Diagnosis not present

## 2023-01-10 ENCOUNTER — Other Ambulatory Visit: Payer: Self-pay | Admitting: Internal Medicine

## 2023-01-10 DIAGNOSIS — E890 Postprocedural hypothyroidism: Secondary | ICD-10-CM

## 2023-01-11 NOTE — Telephone Encounter (Signed)
Requested Prescriptions  Pending Prescriptions Disp Refills   SYNTHROID 100 MCG tablet [Pharmacy Med Name: Synthroid 100 MCG Oral Tablet] 90 tablet 0    Sig: Take 1 tablet by mouth once daily     Endocrinology:  Hypothyroid Agents Passed - 01/10/2023  9:13 AM      Passed - TSH in normal range and within 360 days    TSH  Date Value Ref Range Status  10/19/2022 1.760 0.450 - 4.500 uIU/mL Final         Passed - Valid encounter within last 12 months    Recent Outpatient Visits           2 months ago Annual physical exam   [place] [facility] at [facility], Nyoka Cowden, MD   7 months ago Flu-like symptoms   [place] [facility] at [facility], Nyoka Cowden, MD   10 months ago Acute non-recurrent maxillary sinusitis   [place] [facility] at [facility], Nyoka Cowden, MD   1 year ago Annual physical exam   [facility] at [facility], Nyoka Cowden, MD   1 year ago Postoperative hypothyroidism   [facility] at [facility], Nyoka Cowden, MD       Future Appointments             In 9 months Judithann Graves, Nyoka Cowden, MD [facility] at [facility], [facility]

## 2023-04-19 ENCOUNTER — Encounter: Payer: Self-pay | Admitting: Internal Medicine

## 2023-04-19 ENCOUNTER — Ambulatory Visit (INDEPENDENT_AMBULATORY_CARE_PROVIDER_SITE_OTHER): Payer: Medicare HMO | Admitting: Internal Medicine

## 2023-04-19 VITALS — BP 128/79 | HR 72 | Ht 62.5 in | Wt 162.4 lb

## 2023-04-19 DIAGNOSIS — R002 Palpitations: Secondary | ICD-10-CM | POA: Diagnosis not present

## 2023-04-19 DIAGNOSIS — R519 Headache, unspecified: Secondary | ICD-10-CM | POA: Diagnosis not present

## 2023-04-19 DIAGNOSIS — R030 Elevated blood-pressure reading, without diagnosis of hypertension: Secondary | ICD-10-CM

## 2023-04-19 DIAGNOSIS — R7989 Other specified abnormal findings of blood chemistry: Secondary | ICD-10-CM | POA: Diagnosis not present

## 2023-04-19 NOTE — Progress Notes (Signed)
Date:  04/19/2023   Name:  Kristin Escobar   DOB:  February 03, 1951   MRN:  [id number]   Chief Complaint: Hypertension (High BP with headaches. Started last week. )  Palpitations  This is a new problem. The current episode started yesterday (during the night). The symptoms are aggravated by stress (recent death of her mother). Associated symptoms include an irregular heartbeat. Pertinent negatives include no anxiety, chest pain, coughing, diaphoresis, fever or shortness of breath. She has tried nothing for the symptoms.  Headache  This is a new problem. The pain is located in the Bilateral and temporal region. The quality of the pain is described as aching and throbbing. The pain is mild. Associated symptoms include neck pain. Pertinent negatives include no coughing, fever, scalp tenderness, sinus pressure or visual change. She has tried acetaminophen for the symptoms. The treatment provided mild relief.  Neck Pain  This is a new problem. The current episode started 1 to 4 weeks ago. The problem occurs daily. The problem has been gradually improving. The pain is associated with nothing. The pain is present in the right side. The quality of the pain is described as aching. The pain is mild. Associated symptoms include headaches. Pertinent negatives include no chest pain, fever or visual change.    Review of Systems  Constitutional:  Positive for fatigue. Negative for diaphoresis and fever.  HENT:  Negative for sinus pressure.   Eyes:  Negative for visual disturbance.  Respiratory:  Negative for cough, chest tightness and shortness of breath.   Cardiovascular:  Positive for palpitations. Negative for chest pain and leg swelling.  Musculoskeletal:  Positive for neck pain.  Neurological:  Positive for headaches.  Psychiatric/Behavioral:  Positive for sleep disturbance (very sleepy- taking naps). Negative for dysphoric mood. The patient is not nervous/anxious.      Lab Results  Component Value  Date   NA 143 10/04/2022   K 3.8 10/04/2022   CO2 27 (A) 10/04/2022   GLUCOSE 100 (H) 10/16/2021   BUN 17 10/04/2022   CREATININE 0.9 10/04/2022   CALCIUM 9.0 10/04/2022   EGFR 69 10/04/2022   GFRNONAA 57 (L) 10/08/2019   Lab Results  Component Value Date   CHOL 194 10/19/2022   HDL 52 10/19/2022   LDLCALC 112 (H) 10/19/2022   TRIG 170 (H) 10/19/2022   CHOLHDL 3.7 10/19/2022   Lab Results  Component Value Date   TSH 1.760 10/19/2022   Lab Results  Component Value Date   HGBA1C 5.9 (H) 10/19/2022   Lab Results  Component Value Date   WBC 6.8 10/04/2022   HGB 13.9 10/04/2022   HCT 40 10/04/2022   MCV 95 10/16/2021   PLT 239 10/04/2022   Lab Results  Component Value Date   ALT 19 10/04/2022   AST 19 10/04/2022   ALKPHOS 87 10/04/2022   BILITOT 0.4 10/16/2021   Lab Results  Component Value Date   VD25OH 18.1 (L) 10/19/2022     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   Tobacco use disorder 07/12/2017   Vitamin D deficiency 06/14/2017   Prediabetes 06/14/2017    No Known Allergies  Past Surgical History:  Procedure Laterality Date   COLONOSCOPY WITH PROPOFOL N/A 07/17/2017   Procedure: COLONOSCOPY WITH PROPOFOL;  Surgeon: Toney Reil, MD;  Location: [facility];  Service: Endoscopy;  Laterality: N/A;   COLONOSCOPY WITH PROPOFOL N/A 12/17/2018   Procedure: COLONOSCOPY WITH PROPOFOL;  Surgeon: Toney Reil, MD;  Location: [facility];  Service: Endoscopy;  Laterality: N/A;   LAPAROSCOPIC ILEOCECECTOMY  09/2017   neuroendocrine tumor   POLYPECTOMY  07/17/2017   Procedure: POLYPECTOMY;  Surgeon: Toney Reil, MD;  Location: [facility];  Service: Endoscopy;;   POLYPECTOMY  12/17/2018   Procedure: POLYPECTOMY;  Surgeon: Toney Reil, MD;  Location: [facility];  Service: Endoscopy;;   TOTAL THYROIDECTOMY  08/2017   no carcinoma    Social History   Tobacco Use   Smoking status: Every Day    Current packs/day: 0.10    Average packs/day: 0.1 packs/day for 20.0 years (2.0 ttl pk-yrs)    Types: Cigarettes   Smokeless tobacco: Never   Tobacco comments:    Smokes 2 -4 cigarettes a day  Vaping Use   Vaping status: Never Used  Substance Use Topics   Alcohol use: Yes    Comment: 1-2 drinks/month   Drug use: No     Medication list has been reviewed and updated.  Current Meds  Medication Sig   cholestyramine (QUESTRAN) 4 g packet Take 1 packet by mouth daily.   omeprazole (PRILOSEC) 20 MG capsule Take 1 capsule (20 mg total) by mouth 2 (two) times daily.   SYNTHROID 100 MCG tablet Take 1 tablet by mouth once daily   Vitamin D, Ergocalciferol, (DRISDOL) 1.25 MG (50000 UNIT) CAPS capsule Take 1 capsule (50,000 Units total) by mouth every 7 (seven) days.       04/19/2023    2:12 PM 10/19/2022    8:18 AM 03/09/2022    1:43 PM 10/16/2021    8:59 AM  GAD 7 : Generalized Anxiety Score  Nervous, Anxious, on Edge 0 0 0 0  Control/stop worrying 3 1 0 2  Worry too much - different things 3 1 0 2  Trouble relaxing 3 0 0 0  Restless 0 0 0 0  Easily annoyed or irritable 0 0 0 0  Afraid - awful might happen 3 0 0 0  Total GAD 7 Score 12 2 0 4  Anxiety Difficulty Somewhat difficult Not difficult at all Not difficult at all Not difficult at all       04/19/2023    2:12 PM 10/19/2022    8:18 AM 03/30/2022    3:04 PM  Depression screen PHQ 2/9  Decreased Interest 1 1 0  Down, Depressed, Hopeless 0 0 0  PHQ - 2 Score 1 1 0  Altered sleeping 3 2   Tired, decreased energy 3 2   Change in appetite 2 2   Feeling bad or failure about yourself  0 0   Trouble concentrating 0 0   Moving slowly or fidgety/restless 0 0   Suicidal thoughts 0 0   PHQ-9 Score 9 7   Difficult doing work/chores Somewhat difficult  Not difficult at all     BP Readings from Last 3 Encounters:  04/19/23 128/79  10/19/22 114/64  05/25/22 124/76    Physical Exam Vitals and nursing note reviewed.  Constitutional:      General: She is not in acute distress.    Appearance: Normal appearance. She is well-developed.  HENT:     Head: Normocephalic and atraumatic.     Jaw: There is normal jaw occlusion.     Comments: Bilateral temporal tenderness; no cord Cardiovascular:  Rate and Rhythm: Normal rate and regular rhythm.     Heart sounds: Normal heart sounds.  Pulmonary:     Effort: Pulmonary effort is normal. No respiratory distress.     Breath sounds: No wheezing or rhonchi.  Musculoskeletal:     Cervical back: Normal range of motion.  Lymphadenopathy:     Cervical: No cervical adenopathy.  Skin:    General: Skin is warm and dry.     Findings: No rash.  Neurological:     Mental Status: She is alert and oriented to person, place, and time.  Psychiatric:        Mood and Affect: Mood normal.        Behavior: Behavior normal.     Wt Readings from Last 3 Encounters:  04/19/23 162 lb 6.4 oz (73.7 kg)  10/19/22 159 lb (72.1 kg)  05/25/22 160 lb (72.6 kg)    BP 128/79 (BP Location: Left Arm, Cuff Size: Large)   Pulse 72   Ht 5' 2.5" (1.588 m)   Wt 162 lb 6.4 oz (73.7 kg)   SpO2 97%   BMI 29.23 kg/m   Assessment and Plan:  Problem List Items Addressed This Visit   None Visit Diagnoses     Palpitations    -  Primary   EKG normal; will get labs   Relevant Orders   EKG 12-Lead (Completed)   CBC with Differential/Platelet   Comprehensive metabolic panel   TSH   Temporal headache       Sed rate to rule out TA   Relevant Orders   Sedimentation rate   Elevated blood pressure reading in office without diagnosis of hypertension       initial BP elevated but now normal x 4 no cause other than stress due to recent loss will get screening labs       No follow-ups on file.    Reubin Milan, MD [facility] [place]

## 2023-04-20 LAB — COMPREHENSIVE METABOLIC PANEL
ALT: 18 [IU]/L (ref 0–32)
AST: 19 [IU]/L (ref 0–40)
Albumin: 4.4 g/dL (ref 3.8–4.8)
Alkaline Phosphatase: 100 [IU]/L (ref 44–121)
BUN/Creatinine Ratio: 18 (ref 12–28)
BUN: 15 mg/dL (ref 8–27)
Bilirubin Total: 0.4 mg/dL (ref 0.0–1.2)
CO2: 26 mmol/L (ref 20–29)
Calcium: 9 mg/dL (ref 8.7–10.3)
Chloride: 106 mmol/L (ref 96–106)
Creatinine, Ser: 0.84 mg/dL (ref 0.57–1.00)
Globulin, Total: 2.9 g/dL (ref 1.5–4.5)
Glucose: 88 mg/dL (ref 70–99)
Potassium: 4.2 mmol/L (ref 3.5–5.2)
Sodium: 143 mmol/L (ref 134–144)
Total Protein: 7.3 g/dL (ref 6.0–8.5)
eGFR: 74 mL/min/{1.73_m2} (ref >59)

## 2023-04-20 LAB — CBC WITH DIFFERENTIAL/PLATELET
Basophils Absolute: 0 10*3/uL (ref 0.0–0.2)
Basos: 1 %
EOS (ABSOLUTE): 0.2 10*3/uL (ref 0.0–0.4)
Eos: 5 %
Hematocrit: 43.1 % (ref 34.0–46.6)
Hemoglobin: 14.1 g/dL (ref 11.1–15.9)
Immature Grans (Abs): 0 10*3/uL (ref 0.0–0.1)
Immature Granulocytes: 0 %
Lymphocytes Absolute: 2 10*3/uL (ref 0.7–3.1)
Lymphs: 41 %
MCH: 31.8 pg (ref 26.6–33.0)
MCHC: 32.7 g/dL (ref 31.5–35.7)
MCV: 97 fL (ref 79–97)
Monocytes Absolute: 0.5 10*3/uL (ref 0.1–0.9)
Monocytes: 10 %
Neutrophils Absolute: 2.2 10*3/uL (ref 1.4–7.0)
Neutrophils: 43 %
Platelets: 249 10*3/uL (ref 150–450)
RBC: 4.44 x10E6/uL (ref 3.77–5.28)
RDW: 13.2 % (ref 11.7–15.4)
WBC: 5 10*3/uL (ref 3.4–10.8)

## 2023-04-20 LAB — SEDIMENTATION RATE: Sed Rate: 12 mm/h (ref 0–40)

## 2023-04-20 LAB — TSH: TSH: 8.97 u[IU]/mL — ABNORMAL HIGH (ref 0.450–4.500)

## 2023-04-24 NOTE — Progress Notes (Signed)
Labs added.

## 2023-05-02 LAB — T4: T4, Total: 7.6 ug/dL (ref 4.5–12.0)

## 2023-05-02 LAB — T3: T3, Total: 99 ng/dL (ref 71–180)

## 2023-05-02 LAB — SPECIMEN STATUS REPORT

## 2023-06-18 ENCOUNTER — Telehealth: Payer: Self-pay | Admitting: Internal Medicine

## 2023-06-18 NOTE — Telephone Encounter (Signed)
Copied from CRM [id number]. Topic: Medicare AWV >> Jun 18, 2023  2:31 PM Payton Doughty wrote: Reason for CRM: Called LVM 06/18/2023 to schedule AWV. Please schedule Virtual or Telehealth visits ONLY.   Verlee Rossetti; Care Guide Ambulatory Clinical Support [place] [facility] Direct Dial: [phone]

## 2023-06-27 ENCOUNTER — Telehealth: Payer: Self-pay | Admitting: Internal Medicine

## 2023-06-27 NOTE — Telephone Encounter (Signed)
Copied from CRM [id number]. Topic: Appointment Scheduling - Scheduling Inquiry for Clinic >> Jun 27, 2023 11:47 AM Turkey B wrote: Reason for CRM: pt called in, doesn't want to see Dr Judithann Graves anymore, so she has scheduled a np appt with Dr Caren Griffins at [facility]

## 2023-06-27 NOTE — Telephone Encounter (Signed)
Noted  KP

## 2023-07-15 DIAGNOSIS — Z961 Presence of intraocular lens: Secondary | ICD-10-CM | POA: Diagnosis not present

## 2023-07-15 DIAGNOSIS — H04123 Dry eye syndrome of bilateral lacrimal glands: Secondary | ICD-10-CM | POA: Diagnosis not present

## 2023-07-15 DIAGNOSIS — H43812 Vitreous degeneration, left eye: Secondary | ICD-10-CM | POA: Diagnosis not present

## 2023-07-15 DIAGNOSIS — H35033 Hypertensive retinopathy, bilateral: Secondary | ICD-10-CM | POA: Diagnosis not present

## 2023-08-08 ENCOUNTER — Other Ambulatory Visit: Payer: Self-pay | Admitting: Internal Medicine

## 2023-08-08 DIAGNOSIS — K529 Noninfective gastroenteritis and colitis, unspecified: Secondary | ICD-10-CM

## 2023-08-09 NOTE — Telephone Encounter (Signed)
 Requested Prescriptions  Pending Prescriptions Disp Refills   omeprazole (PRILOSEC) 20 MG capsule [Pharmacy Med Name: Omeprazole 20 MG Oral Capsule Delayed Release] 28 capsule 0    Sig: Take 1 capsule by mouth twice daily     Gastroenterology: Proton Pump Inhibitors Passed - 08/09/2023 11:23 AM      Passed - Valid encounter within last 12 months    Recent Outpatient Visits           3 months ago Palpitations   [place] [facility] at [facility], Nyoka Cowden, MD   9 months ago Annual physical exam   [facility] at [facility], Nyoka Cowden, MD   1 year ago Flu-like symptoms   [place] [facility] at [facility], Nyoka Cowden, MD   1 year ago Acute non-recurrent maxillary sinusitis   [place] [facility] at [facility], Nyoka Cowden, MD   1 year ago Annual physical exam   [facility] at [facility], Nyoka Cowden, MD       Future Appointments             In 1 month Larae Grooms, NP [facility]

## 2023-08-21 ENCOUNTER — Other Ambulatory Visit: Payer: Self-pay

## 2023-08-21 DIAGNOSIS — E890 Postprocedural hypothyroidism: Secondary | ICD-10-CM

## 2023-08-21 NOTE — Telephone Encounter (Signed)
 MED REFILL.

## 2023-08-28 ENCOUNTER — Ambulatory Visit
Admission: RE | Admit: 2023-08-28 | Discharge: 2023-08-28 | Disposition: A | Discharge status: Home / Self Care | Source: Ambulatory Visit | Attending: Internal Medicine | Admitting: Internal Medicine

## 2023-08-28 DIAGNOSIS — Z1231 Encounter for screening mammogram for malignant neoplasm of breast: Secondary | ICD-10-CM | POA: Insufficient documentation

## 2023-09-19 ENCOUNTER — Ambulatory Visit (INDEPENDENT_AMBULATORY_CARE_PROVIDER_SITE_OTHER): Payer: Self-pay | Admitting: Nurse Practitioner

## 2023-09-19 ENCOUNTER — Encounter: Payer: Self-pay | Admitting: Nurse Practitioner

## 2023-09-19 VITALS — BP 134/86 | HR 62 | Temp 97.4°F | Ht 62.0 in | Wt 164.6 lb

## 2023-09-19 DIAGNOSIS — C7A8 Other malignant neuroendocrine tumors: Secondary | ICD-10-CM | POA: Diagnosis not present

## 2023-09-19 DIAGNOSIS — E559 Vitamin D deficiency, unspecified: Secondary | ICD-10-CM | POA: Diagnosis not present

## 2023-09-19 DIAGNOSIS — E890 Postprocedural hypothyroidism: Secondary | ICD-10-CM | POA: Diagnosis not present

## 2023-09-19 DIAGNOSIS — R7303 Prediabetes: Secondary | ICD-10-CM

## 2023-09-19 MED ORDER — SYNTHROID 100 MCG PO TABS: 100.0000 ug | ORAL_TABLET | Freq: Every day | ORAL | 1 refills | Status: DC

## 2023-09-19 NOTE — Assessment & Plan Note (Signed)
 Labs ordered at visit today.  Refilled Levothyroxine .  Follow up in 6 weeks to reevaluate labs.

## 2023-09-19 NOTE — Progress Notes (Signed)
 BP 134/86   Pulse 62   Temp (!) 97.4 F (36.3 C) (Oral)   Ht 5\' 2"  (1.575 m)   Wt 164 lb 9.6 oz (74.7 kg)   SpO2 93%   BMI 30.11 kg/m    Subjective:    Patient ID: Kristin Escobar, female    DOB: 1950-08-24, 73 y.o.   MRN: [id number]  HPI: Kristin Escobar is a 73 y.o. female  Chief Complaint  Patient presents with   Establish Care    Feeling tired all the time    Patient presents to clinic to establish care with new PCP.  Introduced to Publishing rights manager role and practice setting.  All questions answered.  Discussed provider/patient relationship and expectations.  Patient reports a history of vit d deficiency, hypothyroid, small bowel cancer, and prediabetes, hyperlipidemia. Quit smoking about 2 months ago.    Patient denies a history of: Hypertension, Elevated Cholesterol, Diabetes, Thyroid  problems, Depression, Anxiety, Neurological problems, and Abdominal problems.   Patient states she has been having a good amount of fatigue.  She has ben out of her levothyroxine  for about a month now.  Noticed that the fatigue worsened since stopping the medication.  She has has had a vit d deficiency.  Not currently taking a supplement.  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)    Past Medical History:   Diagnosis Date   Former smoker    Vertigo    Past Surgical History:  Procedure Laterality Date   COLONOSCOPY WITH PROPOFOL  N/A 07/17/2017   Procedure: COLONOSCOPY WITH PROPOFOL ;  Surgeon: Selena Daily, MD;  Location: [facility];  Service: Endoscopy;  Laterality: N/A;   COLONOSCOPY WITH PROPOFOL  N/A 12/17/2018   Procedure: COLONOSCOPY WITH PROPOFOL ;  Surgeon: Selena Daily, MD;  Location: [facility];  Service: Endoscopy;  Laterality: N/A;   LAPAROSCOPIC ILEOCECECTOMY  09/2017   neuroendocrine tumor   POLYPECTOMY  07/17/2017   Procedure: POLYPECTOMY;  Surgeon: Selena Daily, MD;  Location: [facility];  Service: Endoscopy;;   POLYPECTOMY  12/17/2018   Procedure: POLYPECTOMY;  Surgeon: Selena Daily, MD;  Location: [facility];  Service: Endoscopy;;   TOTAL THYROIDECTOMY  08/2017   no carcinoma     Review of Systems  Constitutional:  Positive for fatigue.    Per HPI unless specifically indicated above     Objective:    BP 134/86   Pulse 62   Temp (!) 97.4 F (36.3 C) (Oral)   Ht 5\' 2"  (1.575 m)   Wt 164 lb 9.6 oz (74.7 kg)   SpO2 93%   BMI 30.11 kg/m   Wt Readings from Last 3 Encounters:  09/19/23 164 lb 9.6 oz (74.7 kg)  04/19/23 162 lb 6.4 oz (73.7 kg)  10/19/22 159 lb (72.1 kg)    Physical Exam Vitals and nursing  note reviewed.  Constitutional:      General: She is not in acute distress.    Appearance: Normal appearance. She is normal weight. She is not ill-appearing, toxic-appearing or diaphoretic.  HENT:     Head: Normocephalic.     Right Ear: External ear normal.     Left Ear: External ear normal.     Nose: Nose normal.     Mouth/Throat:     Mouth: Mucous membranes are moist.     Pharynx: Oropharynx is clear.  Eyes:     General:        Right eye: No discharge.        Left eye: No discharge.     Extraocular Movements: Extraocular movements intact.     Conjunctiva/sclera: Conjunctivae normal.      Pupils: Pupils are equal, round, and reactive to light.  Cardiovascular:     Rate and Rhythm: Normal rate and regular rhythm.     Heart sounds: No murmur heard. Pulmonary:     Effort: Pulmonary effort is normal. No respiratory distress.     Breath sounds: Normal breath sounds. No wheezing or rales.  Musculoskeletal:     Cervical back: Normal range of motion and neck supple.  Skin:    General: Skin is warm and dry.     Capillary Refill: Capillary refill takes less than 2 seconds.  Neurological:     General: No focal deficit present.     Mental Status: She is alert and oriented to person, place, and time. Mental status is at baseline.  Psychiatric:        Mood and Affect: Mood normal.        Behavior: Behavior normal.        Thought Content: Thought content normal.        Judgment: Judgment normal.     Results for orders placed or performed in visit on 04/19/23  CBC with Differential/Platelet   Collection Time: 04/19/23  3:30 PM  Result Value Ref Range   WBC 5.0 3.4 - 10.8 x10E3/uL   RBC 4.44 3.77 - 5.28 x10E6/uL   Hemoglobin 14.1 11.1 - 15.9 g/dL   Hematocrit 14.7 82.9 - 46.6 %   MCV 97 79 - 97 fL   MCH 31.8 26.6 - 33.0 pg   MCHC 32.7 31.5 - 35.7 g/dL   RDW 56.2 13.0 - 86.5 %   Platelets 249 150 - 450 x10E3/uL   Neutrophils 43 Not Estab. %   Lymphs 41 Not Estab. %   Monocytes 10 Not Estab. %   Eos 5 Not Estab. %   Basos 1 Not Estab. %   Neutrophils Absolute 2.2 1.4 - 7.0 x10E3/uL   Lymphocytes Absolute 2.0 0.7 - 3.1 x10E3/uL   Monocytes Absolute 0.5 0.1 - 0.9 x10E3/uL   EOS (ABSOLUTE) 0.2 0.0 - 0.4 x10E3/uL   Basophils Absolute 0.0 0.0 - 0.2 x10E3/uL   Immature Granulocytes 0 Not Estab. %   Immature Grans (Abs) 0.0 0.0 - 0.1 x10E3/uL  Comprehensive metabolic panel   Collection Time: 04/19/23  3:30 PM  Result Value Ref Range   Glucose 88 70 - 99 mg/dL   BUN 15 8 - 27 mg/dL   Creatinine, Ser 7.84 0.57 - 1.00 mg/dL   eGFR 74 >69 GE/XBM/8.41   BUN/Creatinine Ratio 18  12 - 28   Sodium 143 134 - 144 mmol/L   Potassium 4.2 3.5 - 5.2 mmol/L   Chloride 106 96 - 106 mmol/L   CO2 26 20 -  29 mmol/L   Calcium  9.0 8.7 - 10.3 mg/dL   Total Protein 7.3 6.0 - 8.5 g/dL   Albumin 4.4 3.8 - 4.8 g/dL   Globulin, Total 2.9 1.5 - 4.5 g/dL   Bilirubin Total 0.4 0.0 - 1.2 mg/dL   Alkaline Phosphatase 100 44 - 121 IU/L   AST 19 0 - 40 IU/L   ALT 18 0 - 32 IU/L  TSH   Collection Time: 04/19/23  3:30 PM  Result Value Ref Range   TSH 8.970 (H) 0.450 - 4.500 uIU/mL  Sedimentation rate   Collection Time: 04/19/23  3:30 PM  Result Value Ref Range   Sed Rate 12 0 - 40 mm/hr  T4   Collection Time: 04/19/23  3:30 PM  Result Value Ref Range   T4, Total 7.6 4.5 - 12.0 ug/dL  T3   Collection Time: 04/19/23  3:30 PM  Result Value Ref Range   T3, Total 99 71 - 180 ng/dL  Specimen status report   Collection Time: 04/19/23  3:30 PM  Result Value Ref Range   specimen status report Comment       Assessment & Plan:   Problem List Items Addressed This Visit       Digestive   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  Followed by Surgical Oncology.  Continue to follow up with specialist.      Relevant Orders   TSH   Comp Met (CMET)     Endocrine   Postoperative hypothyroidism (Chronic)   Labs ordered at visit today.  Refilled Levothyroxine .  Follow up in 6 weeks to reevaluate labs.       Relevant Medications   SYNTHROID  100 MCG tablet   Other Relevant Orders   TSH   T4, free   Comp Met (CMET)     Other   Vitamin D  deficiency - Primary (Chronic)   Labs ordered at visit today.  Will make recommendations based on lab results.        Relevant Orders   Vitamin D  (25 hydroxy)   Prediabetes (Chronic)   Last A1c was 5.9%.  Labs ordered at visit today.  Will make recommendations based on lab results.        Relevant Orders   TSH   Comp Met (CMET)   HgB A1c     Follow up plan: Return in about 6 weeks (around  10/31/2023) for Thyroid  check.

## 2023-09-19 NOTE — Assessment & Plan Note (Signed)
 Last A1c was 5.9%.  Labs ordered at visit today.  Will make recommendations based on lab results.

## 2023-09-19 NOTE — Assessment & Plan Note (Signed)
 Resection in 2019.  Followed by Surgical Oncology.  Continue to follow up with specialist.

## 2023-09-19 NOTE — Assessment & Plan Note (Signed)
 Labs ordered at visit today.  Will make recommendations based on lab results.

## 2023-09-20 ENCOUNTER — Other Ambulatory Visit: Payer: Self-pay | Admitting: Nurse Practitioner

## 2023-09-20 DIAGNOSIS — E559 Vitamin D deficiency, unspecified: Secondary | ICD-10-CM

## 2023-09-20 LAB — COMPREHENSIVE METABOLIC PANEL WITH GFR
ALT: 27 IU/L (ref 0–32)
AST: 30 IU/L (ref 0–40)
Albumin: 4.4 g/dL (ref 3.8–4.8)
Alkaline Phosphatase: 82 IU/L (ref 44–121)
BUN/Creatinine Ratio: 14 (ref 12–28)
BUN: 12 mg/dL (ref 8–27)
Bilirubin Total: 0.6 mg/dL (ref 0.0–1.2)
CO2: 24 mmol/L (ref 20–29)
Calcium: 9.1 mg/dL (ref 8.7–10.3)
Chloride: 102 mmol/L (ref 96–106)
Creatinine, Ser: 0.88 mg/dL (ref 0.57–1.00)
Globulin, Total: 2.9 g/dL (ref 1.5–4.5)
Glucose: 86 mg/dL (ref 70–99)
Potassium: 3.7 mmol/L (ref 3.5–5.2)
Sodium: 140 mmol/L (ref 134–144)
Total Protein: 7.3 g/dL (ref 6.0–8.5)
eGFR: 70 mL/min/{1.73_m2} (ref >59)

## 2023-09-20 LAB — VITAMIN D 25 HYDROXY (VIT D DEFICIENCY, FRACTURES): Vit D, 25-Hydroxy: 17.9 ng/mL — ABNORMAL LOW (ref 30.0–100.0)

## 2023-09-20 LAB — TSH: TSH: 127 u[IU]/mL — ABNORMAL HIGH (ref 0.450–4.500)

## 2023-09-20 LAB — T4, FREE: Free T4: 0.23 ng/dL — ABNORMAL LOW (ref 0.82–1.77)

## 2023-09-20 LAB — HEMOGLOBIN A1C
Est. average glucose Bld gHb Est-mCnc: 123 mg/dL
Hgb A1c MFr Bld: 5.9 % — ABNORMAL HIGH (ref 4.8–5.6)

## 2023-09-20 NOTE — Telephone Encounter (Signed)
 Copied from CRM [id number]. Topic: Clinical - Medication Refill >> Sep 20, 2023  4:16 PM Tiffany S wrote: Most Recent Primary Care Visit:  Provider: Aileen Alexanders  Department: [facility]  Visit Type: NEW PATIENT  Date: 09/19/2023  Medication: Vitamin D , Ergocalciferol , (DRISDOL ) 1.25 MG (50000 UNIT) CAPS capsule [[id number]]  Has the patient contacted their pharmacy? Yes (Agent: If no, request that the patient contact the pharmacy for the refill. If patient does not wish to contact the pharmacy document the reason why and proceed with request.) (Agent: If yes, when and what did the pharmacy advise?)  Is this the correct pharmacy for this prescription? Yes If no, delete pharmacy and type the correct one.  This is the patient's preferred pharmacy:  [facility] [address] ([address]), [place] - [address] [place] ([place]) [place] [address] Phone: [phone] Fax: [phone]  [facility] [address] [place] - [address] [place] [address] Phone: [phone] Fax: [phone]   Has the prescription been filled recently? Yes  Is the patient out of the medication? Yes  Has the patient been seen for an appointment in the last year OR does the patient have an upcoming appointment? Yes  Can we respond through MyChart? No  Agent: Please be advised that Rx refills may take up to 3 business days. We ask that you follow-up with your pharmacy.

## 2023-09-23 ENCOUNTER — Other Ambulatory Visit: Payer: Self-pay | Admitting: Internal Medicine

## 2023-09-23 DIAGNOSIS — E559 Vitamin D deficiency, unspecified: Secondary | ICD-10-CM

## 2023-09-23 NOTE — Telephone Encounter (Signed)
 Requested medication (s) are due for refill today: routing for review  Requested medication (s) are on the active medication list: yes  Last refill:  10/23/22  Future visit scheduled: yes  Notes to clinic:   Manual Review: Route requests for 50,000 IU strength to the provider      Requested Prescriptions  Pending Prescriptions Disp Refills   Vitamin D , Ergocalciferol , (DRISDOL ) 1.25 MG (50000 UNIT) CAPS capsule 12 capsule 3    Sig: Take 1 capsule (50,000 Units total) by mouth every 7 (seven) days.     Endocrinology:  Vitamins - Vitamin D  Supplementation 2 Failed - 09/23/2023  8:07 AM      Failed - Manual Review: Route requests for 50,000 IU strength to the provider      Failed - Vitamin D  in normal range and within 360 days    Vit D, 25-Hydroxy  Date Value Ref Range Status  09/19/2023 17.9 (L) 30.0 - 100.0 ng/mL Final    Comment:    Vitamin D  deficiency has been defined by the Institute of Medicine and an Endocrine Society practice guideline as a level of serum 25-OH vitamin D  less than 20 ng/mL (1,2). The Endocrine Society went on to further define vitamin D  insufficiency as a level between 21 and 29 ng/mL (2). 1. IOM (Institute of Medicine). 2010. Dietary reference    intakes for calcium  and D. [place]: The    [employer]. 2. Holick MF, [place], Bischoff-Ferrari HA, et al.    Evaluation, treatment, and prevention of vitamin D     deficiency: an Endocrine Society clinical practice    guideline. JCEM. 2011 Jul; 96(7):1911-30.          Passed - Ca in normal range and within 360 days    Calcium   Date Value Ref Range Status  09/19/2023 9.1 8.7 - 10.3 mg/dL Final         Passed - Valid encounter within last 12 months    Recent Outpatient Visits           4 days ago Vitamin D  deficiency   [facility] Aileen Alexanders, NP

## 2023-09-25 NOTE — Telephone Encounter (Signed)
 Duplicate request.  Requested Prescriptions  Pending Prescriptions Disp Refills   Vitamin D , Ergocalciferol , (DRISDOL ) 1.25 MG (50000 UNIT) CAPS capsule [Pharmacy Med Name: Vitamin D  (Ergocalciferol ) 1.25 MG (50000 UT) Oral Capsule] 12 capsule 0    Sig: Take 1 capsule by mouth once a week     There is no refill protocol information for this order

## 2023-10-08 DIAGNOSIS — C7A8 Other malignant neuroendocrine tumors: Secondary | ICD-10-CM | POA: Diagnosis not present

## 2023-10-15 ENCOUNTER — Telehealth: Payer: Self-pay | Admitting: Nurse Practitioner

## 2023-10-15 NOTE — Telephone Encounter (Signed)
 Copied from CRM [id number]. Topic: Medicare AWV >> Oct 15, 2023  2:46 PM Juliana Ocean wrote: Reason for CRM: LVM 10/15/2023 to schedule AWV. Please schedule Virtual or Telehealth visits ONLY.   Kristin Escobar; Care Guide Ambulatory Clinical Support [place] [facility] Direct Dial: [phone]

## 2023-10-24 ENCOUNTER — Encounter: Payer: Medicare HMO | Admitting: Internal Medicine

## 2023-10-31 ENCOUNTER — Ambulatory Visit: Admitting: Nurse Practitioner

## 2023-10-31 ENCOUNTER — Encounter: Payer: Self-pay | Admitting: Nurse Practitioner

## 2023-10-31 VITALS — BP 121/80 | HR 79 | Ht 62.0 in | Wt 163.0 lb

## 2023-10-31 DIAGNOSIS — Z0000 Encounter for general adult medical examination without abnormal findings: Secondary | ICD-10-CM

## 2023-10-31 DIAGNOSIS — Z7189 Other specified counseling: Secondary | ICD-10-CM

## 2023-10-31 DIAGNOSIS — C7A8 Other malignant neuroendocrine tumors: Secondary | ICD-10-CM | POA: Diagnosis not present

## 2023-10-31 DIAGNOSIS — R7303 Prediabetes: Secondary | ICD-10-CM | POA: Diagnosis not present

## 2023-10-31 DIAGNOSIS — E890 Postprocedural hypothyroidism: Secondary | ICD-10-CM | POA: Diagnosis not present

## 2023-10-31 NOTE — Progress Notes (Signed)
 Subjective:   Kristin Escobar is a 73 y.o. female who presents for Medicare Annual (Subsequent) preventive examination.  Visit Complete: In person  Patient Medicare AWV questionnaire was completed by the patient on 10/31/2023; I have confirmed that all information answered by patient is correct and no changes since this date.        Objective:     There were no vitals filed for this visit. There is no height or weight on file to calculate BMI.     03/20/2021    2:18 PM 03/16/2020    2:20 PM 12/17/2018    7:07 AM 08/07/2017    9:59 AM 07/24/2017    3:11 PM 07/17/2017    7:10 AM  Advanced Directives  Does Patient Have a Medical Advance Directive? No No No No No No  Would patient like information on creating a medical advance directive? Yes (MAU/Ambulatory/Procedural Areas - Information given) Yes (MAU/Ambulatory/Procedural Areas - Information given) No - Patient declined No - Patient declined No - Patient declined Yes (MAU/Ambulatory/Procedural Areas - Information given)    Current Medications (verified) Outpatient Encounter Medications as of 10/31/2023  Medication Sig   omeprazole  (PRILOSEC) 20 MG capsule Take 1 capsule by mouth twice daily   SYNTHROID  100 MCG tablet Take 1 tablet (100 mcg total) by mouth daily.   Vitamin D , Ergocalciferol , (DRISDOL ) 1.25 MG (50000 UNIT) CAPS capsule Take 1 capsule (50,000 Units total) by mouth every 7 (seven) days. (Patient not taking: Reported on 09/19/2023)   No facility-administered encounter medications on file as of 10/31/2023.    Allergies (verified) Patient has no known allergies.   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)    Vertigo    no episodes in over 10 yrs   Past Surgical History:  Procedure Laterality  Date   COLONOSCOPY WITH PROPOFOL  N/A 07/17/2017   Procedure: COLONOSCOPY WITH PROPOFOL ;  Surgeon: Selena Daily, MD;  Location: [facility];  Service: Endoscopy;  Laterality: N/A;   COLONOSCOPY WITH PROPOFOL  N/A 12/17/2018   Procedure: COLONOSCOPY WITH PROPOFOL ;  Surgeon: Selena Daily, MD;  Location: [facility];  Service: Endoscopy;  Laterality: N/A;   LAPAROSCOPIC ILEOCECECTOMY  09/2017   neuroendocrine tumor   POLYPECTOMY  07/17/2017   Procedure: POLYPECTOMY;  Surgeon: Selena Daily, MD;  Location: [facility];  Service: Endoscopy;;   POLYPECTOMY  12/17/2018   Procedure: POLYPECTOMY;  Surgeon: Selena Daily, MD;  Location: [facility];  Service: Endoscopy;;   TOTAL THYROIDECTOMY  08/2017   no carcinoma   Family History  Problem Relation Age of Onset   Stroke Mother    Colon cancer Father    Prostate cancer Father    Breast cancer Maternal Aunt 70   Liver cancer Maternal Aunt    Stomach cancer Maternal Aunt    Social History   Socioeconomic History   Marital status: Single    Spouse name: Not on file   Number of children: 1   Years of education: Not on file   Highest education level: Not on file  Occupational History   Occupation: Retired   Tobacco Use   Smoking status: Former    Current packs/day: 0.10    Average packs/day: 0.1 packs/day for 20.0 years (2.0 ttl pk-yrs)  Types: Cigarettes   Smokeless tobacco: Never   Tobacco comments:    Smokes 2 -4 cigarettes a day  Vaping Use   Vaping status: Never Used  Substance and Sexual Activity   Alcohol use: Yes    Comment: 1-2 drinks/month   Drug use: No   Sexual activity: Never  Other Topics Concern   Not on file  Social History Narrative   Pt lives alone   Social Drivers of Health   Financial Resource Strain: Low Risk  (03/30/2022)   Overall Financial Resource Strain (CARDIA)    Difficulty of Paying Living Expenses: Not hard at all  Food Insecurity: No Food  Insecurity (03/30/2022)   Hunger Vital Sign    Worried About Running Out of Food in the Last Year: Never true    Ran Out of Food in the Last Year: Never true  Transportation Needs: No Transportation Needs (03/30/2022)   PRAPARE - Administrator, Civil Service (Medical): No    Lack of Transportation (Non-Medical): No  Physical Activity: Inactive (03/30/2022)   Exercise Vital Sign    Days of Exercise per Week: 0 days    Minutes of Exercise per Session: 0 min  Stress: No Stress Concern Present (03/30/2022)   [employer] of Occupational Health - Occupational Stress Questionnaire    Feeling of Stress : Not at all  Social Connections: Moderately Integrated (03/30/2022)   Social Connection and Isolation Panel [NHANES]    Frequency of Communication with Friends and Family: More than three times a week    Frequency of Social Gatherings with Friends and Family: Twice a week    Attends Religious Services: More than 4 times per year    Active Member of [employer] or Organizations: Yes    Attends Engineer, structural: More than 4 times per year    Marital Status: Never married    Tobacco Counseling Counseling given: Not Answered Tobacco comments: Smokes 2 -4 cigarettes a day   Clinical Intake:                        Activities of Daily Living     No data to display          Patient Care Team: Aileen Alexanders, NP as PCP - General (Nurse Practitioner) Selena Daily, MD as Consulting Physician (Gastroenterology) Alben Alma, MD as Referring Physician (Obstetrics and Gynecology)  Indicate any recent Medical Services you may have received from other than [facility] providers in the past year (date may be approximate).     Assessment:    This is a routine wellness examination for [place].  Hearing/Vision screen No results found.   Goals Addressed   None    Depression Screen    09/19/2023   10:55 AM 04/19/2023    2:12 PM 10/19/2022     8:18 AM 03/30/2022    3:04 PM 03/09/2022    1:43 PM 10/16/2021    8:59 AM 04/17/2021    9:51 AM  PHQ 2/9 Scores  PHQ - 2 Score 0 1 1 0 0 0 0  PHQ- 9 Score 8 9 7  6 6  0    Fall Risk    09/19/2023   10:55 AM 04/19/2023    2:12 PM 10/19/2022    8:18 AM 03/30/2022    3:05 PM 03/09/2022    1:43 PM  Fall Risk   Falls in the past year? 0 0 0 0 0  Number falls  in past yr: 0 0 0 0 0  Injury with Fall? 0 0 0 0 0  Risk for fall due to : No Fall Risks No Fall Risks No Fall Risks No Fall Risks No Fall Risks  Follow up Falls evaluation completed Falls evaluation completed Falls evaluation completed Falls evaluation completed Falls evaluation completed    MEDICARE RISK AT HOME:     Cognitive Function:        03/30/2022    3:06 PM 03/16/2020    2:24 PM  6CIT Screen  What Year? 0 points 0 points  What month? 0 points 0 points  What time? 0 points 0 points  Count back from 20 0 points 0 points  Months in reverse 0 points 0 points  Repeat phrase 0 points 0 points  Total Score 0 points 0 points    Immunizations Immunization History  Administered Date(s) Administered    Astrazeneca Covid-19 Vaccine, Pf, 0.5 Ml Non Us   03/05/2022   Influenza, High Dose Seasonal PF 04/02/2018, 02/10/2019, 01/31/2020, 02/28/2021, 03/05/2023   Influenza-Unspecified 02/26/2022   PFIZER Comirnaty(Gray Top)Covid-19 Tri-Sucrose Vaccine 11/02/2020   PFIZER(Purple Top)SARS-COV-2 Vaccination 07/22/2019, 08/12/2019, 02/22/2020, 11/02/2020, 03/02/2022   Pfizer Covid-19 Vaccine Bivalent Booster 79yrs & up 02/28/2021   Pfizer(Comirnaty)Fall Seasonal Vaccine 12 years and older 03/02/2022   Pneumococcal Conjugate-13 06/14/2017, 04/02/2018   Pneumococcal Polysaccharide-23 10/07/2018, 01/31/2020   Respiratory Syncytial Virus Vaccine,Recomb Aduvanted(Arexvy) 02/26/2022   Zoster Recombinant(Shingrix) 03/13/2018, 02/10/2019    TDAP status: Due, Education has been provided regarding the importance of this vaccine.  Advised may receive this vaccine at local pharmacy or Health Dept. Aware to provide a copy of the vaccination record if obtained from local pharmacy or Health Dept. Verbalized acceptance and understanding.  Flu Vaccine status: Up to date  Pneumococcal vaccine status: Up to date  Covid-19 vaccine status: Completed vaccines  Qualifies for Shingles Vaccine? Yes   Zostavax completed Yes   Shingrix Completed?: Yes  Screening Tests Health Maintenance  Topic Date Due   DTaP/Tdap/Td (1 - Tdap) Never done   COVID-19 Vaccine (8 - 2024-25 season) 01/27/2023   Medicare Annual Wellness (AWV)  03/31/2023   INFLUENZA VACCINE  12/27/2023   MAMMOGRAM  08/27/2024   Colonoscopy  09/20/2024   Pneumonia Vaccine 42+ Years old  Completed   DEXA SCAN  Completed   Hepatitis C Screening  Completed   Zoster Vaccines- Shingrix  Completed   HPV VACCINES  Aged Out   Meningococcal B Vaccine  Aged Out    Health Maintenance  Health Maintenance Due  Topic Date Due   DTaP/Tdap/Td (1 - Tdap) Never done   COVID-19 Vaccine (8 - 2024-25 season) 01/27/2023   Medicare Annual Wellness (AWV)  03/31/2023    Colorectal cancer screening: Type of screening: Colonoscopy. Completed 08/2021. Repeat every 3 years  Mammogram status: Completed 08/28/2023. Repeat every year  Bone Density status: Completed 12/01/2020. Results reflect: Bone density results: OSTEOPENIA. Repeat every 2 years.  Lung Cancer Screening: (Low Dose CT Chest recommended if Age 28-80 years, 20 pack-year currently smoking OR have quit w/in 15years.) does not qualify.   Lung Cancer Screening Referral:   Additional Screening:  Hepatitis C Screening: does not qualify; Completed 10/08/2019  Vision Screening: Recommended annual ophthalmology exams for early detection of glaucoma and other disorders of the eye. Is the patient up to date with their annual eye exam?  Yes  Who is the provider or what is the name of the office in which the patient attends  annual  eye exams? [facility]   Dental Screening: Recommended annual dental exams for proper oral hygiene  Community Resource Referral / Chronic Care Management: CRR required this visit?  No   CCM required this visit?  No     Plan:     I have personally reviewed and noted the following in the patient's chart:   Medical and social history Use of alcohol, tobacco or illicit drugs  Current medications and supplements including opioid prescriptions. Patient is not currently taking opioid prescriptions. Functional ability and status Nutritional status Physical activity Advanced directives List of other physicians Hospitalizations, surgeries, and ER visits in previous 12 months Vitals Screenings to include cognitive, depression, and falls Referrals and appointments  In addition, I have reviewed and discussed with patient certain preventive protocols, quality metrics, and best practice recommendations. A written personalized care plan for preventive services as well as general preventive health recommendations were provided to patient.     Marnie Siren, CMA   10/31/2023   After Visit Summary: (In Person-Printed) AVS printed and given to the patient

## 2023-10-31 NOTE — Assessment & Plan Note (Signed)
 A voluntary discussion about advance care planning including the explanation and discussion of advance directives was extensively discussed  with the patient for 5 minutes with patient.  Explanation about the health care proxy and Living will was reviewed and packet with forms with explanation of how to fill them out was given.  During this discussion, the patient was able to identify a health care proxy as her uncle or her daughter and plans to fill out the paperwork required.  Patient was offered a separate Advance Care Planning visit for further assistance with forms.

## 2023-10-31 NOTE — Assessment & Plan Note (Signed)
 Chronic.  Followed by Oncology.  Reviewed recent note.

## 2023-10-31 NOTE — Progress Notes (Signed)
 BP 121/80   Pulse 79   Ht 5\' 2"  (1.575 m)   Wt 163 lb (73.9 kg)   BMI 29.81 kg/m    Subjective:    Patient ID: Kristin Escobar, female    DOB: 08-Feb-1951, 73 y.o.   MRN: [id number]  HPI: Kristin Escobar is a 73 y.o. female  Chief Complaint  Patient presents with   Medicare Wellness   Medical Management of Chronic Issues   HYPOTHYROIDISM Labs checked in April 2024 after patient had been off levothyroxine  due to being out of it.  TSH was 127.   Thyroid  control status:better Satisfied with current treatment? yes Medication side effects: no Medication compliance: excellent compliance Etiology of hypothyroidism:  Recent dose adjustment:no Fatigue: yes Cold intolerance: no Heat intolerance: no Weight gain: no Weight loss: no Constipation: no Diarrhea/loose stools: no Palpitations: no Lower extremity edema: no Anxiety/depressed mood: no  Relevant past medical, surgical, family and social history reviewed and updated as indicated. Interim medical history since our last visit reviewed. Allergies and medications reviewed and updated.  Review of Systems  Constitutional:  Positive for fatigue. Negative for unexpected weight change.  Cardiovascular:  Negative for palpitations and leg swelling.  Gastrointestinal:  Negative for constipation and diarrhea.  Endocrine: Negative for cold intolerance and heat intolerance.  Psychiatric/Behavioral:  Negative for dysphoric mood. The patient is not nervous/anxious.     Per HPI unless specifically indicated above     Objective:     BP 121/80   Pulse 79   Ht 5\' 2"  (1.575 m)   Wt 163 lb (73.9 kg)   BMI 29.81 kg/m   Wt Readings from Last 3 Encounters:  10/31/23 163 lb (73.9 kg)  09/19/23 164 lb 9.6 oz (74.7 kg)  04/19/23 162 lb 6.4 oz (73.7 kg)    Physical Exam Vitals and nursing note reviewed.  Constitutional:      General: She is not in acute distress.    Appearance: Normal appearance. She is normal weight. She is  not ill-appearing, toxic-appearing or diaphoretic.  HENT:     Head: Normocephalic.     Right Ear: External ear normal.     Left Ear: External ear normal.     Nose: Nose normal.     Mouth/Throat:     Mouth: Mucous membranes are moist.     Pharynx: Oropharynx is clear.  Eyes:     General:        Right eye: No discharge.        Left eye: No discharge.     Extraocular Movements: Extraocular movements intact.     Conjunctiva/sclera: Conjunctivae normal.     Pupils: Pupils are equal, round, and reactive to light.  Cardiovascular:     Rate and Rhythm: Normal rate and regular rhythm.     Heart sounds: No murmur heard. Pulmonary:     Effort: Pulmonary effort is normal. No respiratory distress.     Breath sounds: Normal breath sounds. No wheezing or rales.  Musculoskeletal:     Cervical back: Normal range of motion and neck supple.  Skin:    General: Skin is warm and dry.     Capillary Refill: Capillary refill takes less than 2 seconds.  Neurological:     General: No focal deficit present.     Mental Status: She is alert and oriented to person, place, and time. Mental status is at baseline.  Psychiatric:        Mood and Affect: Mood normal.  Behavior: Behavior normal.        Thought Content: Thought content normal.        Judgment: Judgment normal.     Results for orders placed or performed in visit on 09/19/23  TSH   Collection Time: 09/19/23 11:07 AM  Result Value Ref Range   TSH 127.000 (H) 0.450 - 4.500 uIU/mL  T4, free   Collection Time: 09/19/23 11:07 AM  Result Value Ref Range   Free T4 0.23 (L) 0.82 - 1.77 ng/dL  Comp Met (CMET)   Collection Time: 09/19/23 11:07 AM  Result Value Ref Range   Glucose 86 70 - 99 mg/dL   BUN 12 8 - 27 mg/dL   Creatinine, Ser 4.09 0.57 - 1.00 mg/dL   eGFR 70 >81 XB/JYN/8.29   BUN/Creatinine Ratio 14 12 - 28   Sodium 140 134 - 144 mmol/L   Potassium 3.7 3.5 - 5.2 mmol/L   Chloride 102 96 - 106 mmol/L   CO2 24 20 - 29 mmol/L    Calcium  9.1 8.7 - 10.3 mg/dL   Total Protein 7.3 6.0 - 8.5 g/dL   Albumin 4.4 3.8 - 4.8 g/dL   Globulin, Total 2.9 1.5 - 4.5 g/dL   Bilirubin Total 0.6 0.0 - 1.2 mg/dL   Alkaline Phosphatase 82 44 - 121 IU/L   AST 30 0 - 40 IU/L   ALT 27 0 - 32 IU/L  HgB A1c   Collection Time: 09/19/23 11:07 AM  Result Value Ref Range   Hgb A1c MFr Bld 5.9 (H) 4.8 - 5.6 %   Est. 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  Followed by Oncology.  Reviewed recent note.         Endocrine   Postoperative hypothyroidism (Chronic)   Relevant Orders   TSH   T4     Other   Prediabetes (Chronic)   Relevant Orders   Comp Met (CMET)   Advanced care planning/counseling discussion   A voluntary discussion about advance care planning including the explanation and discussion of advance directives was extensively discussed  with the patient for 5 minutes with patient.  Explanation about the health care proxy and Living will was reviewed and packet with forms with explanation of how to fill them out was given.  During this discussion, the patient was able to identify a health care proxy as her uncle or her daughter and plans to fill out the paperwork required.  Patient was offered a separate Advance Care Planning visit for further assistance with forms.         Other Visit Diagnoses       Encounter for Medicare annual wellness exam    -  Primary        Follow up plan: Return in about 6 months (around 05/01/2024) for HTN, HLD, DM2 FU.

## 2023-11-01 LAB — COMPREHENSIVE METABOLIC PANEL WITH GFR
ALT: 20 IU/L (ref 0–32)
AST: 23 IU/L (ref 0–40)
Albumin: 4.2 g/dL (ref 3.8–4.8)
Alkaline Phosphatase: 80 IU/L (ref 44–121)
BUN/Creatinine Ratio: 16 (ref 12–28)
BUN: 14 mg/dL (ref 8–27)
Bilirubin Total: 0.6 mg/dL (ref 0.0–1.2)
CO2: 23 mmol/L (ref 20–29)
Calcium: 9.3 mg/dL (ref 8.7–10.3)
Chloride: 104 mmol/L (ref 96–106)
Creatinine, Ser: 0.89 mg/dL (ref 0.57–1.00)
Globulin, Total: 2.4 g/dL (ref 1.5–4.5)
Glucose: 95 mg/dL (ref 70–99)
Potassium: 4 mmol/L (ref 3.5–5.2)
Sodium: 141 mmol/L (ref 134–144)
Total Protein: 6.6 g/dL (ref 6.0–8.5)
eGFR: 69 mL/min/{1.73_m2} (ref >59)

## 2023-11-01 LAB — T4: T4, Total: 7.9 ug/dL (ref 4.5–12.0)

## 2023-11-01 LAB — TSH: TSH: 14.3 u[IU]/mL — ABNORMAL HIGH (ref 0.450–4.500)

## 2023-11-04 ENCOUNTER — Ambulatory Visit: Payer: Self-pay | Admitting: Nurse Practitioner

## 2023-11-04 DIAGNOSIS — E890 Postprocedural hypothyroidism: Secondary | ICD-10-CM

## 2023-11-04 MED ORDER — LEVOTHYROXINE SODIUM 112 MCG PO TABS: 112.0000 ug | ORAL_TABLET | Freq: Every day | ORAL | 1 refills | Status: DC

## 2023-11-07 ENCOUNTER — Ambulatory Visit: Payer: Self-pay

## 2023-11-07 ENCOUNTER — Telehealth: Payer: Self-pay

## 2023-11-07 NOTE — Telephone Encounter (Signed)
 Called and discusses lab results with patient. Lab appointment scheduled for 12/17/23.

## 2023-11-07 NOTE — Telephone Encounter (Signed)
 Copied from CRM [id number]. Topic: Clinical - Lab/Test Results >> Nov 07, 2023 11:54 AM Sophia H wrote: Reason for CRM: Patient is calling in today regarding her recent lab results. Prefers to speak with someone from [facility], please reach out. Phone number [phone]

## 2023-11-07 NOTE — Telephone Encounter (Signed)
  FYI Only or Action Required?: Action required by provider  Patient was last seen in primary care on 10/31/2023 by Aileen Alexanders, NP. Called Nurse Triage reporting lab results. Symptoms began N/A. Interventions attempted: Nothing. Symptoms are: stable.  Triage Disposition: Information or Advice Only Call  Patient/caregiver understands and will follow disposition?: Yes  Copied from CRM #[id number]. Topic: Clinical - Lab/Test Results >> Nov 07, 2023 11:46 AM Baldomero Bone wrote: Reason for CRM: Patient is returning a call for lab results. Reason for Disposition  Health Information question, no triage required and triager able to answer question  Answer Assessment - Initial Assessment Questions 1. REASON FOR CALL or QUESTION: What is your reason for calling today? or How can I best help you? or What question do you have that I can help answer?  Results read to patient per K. Curt Dover, NP:     Please let patient know that her thyroid  labs have improved but not back in normal range.  I do recommend we increase the dose of levothyroxine  to 112mcg daily.  I have sent this into the pharmacy.  I would like her to come back in 6 weeks for a lab visit to recheck and make sure they are continuing to improve.  Patient asked about vitamin D , but no new results available.  Call dropped and unable to get patient back on phone  Protocols used: Information Only Call - No Triage-A-AH

## 2023-12-17 ENCOUNTER — Other Ambulatory Visit

## 2023-12-17 DIAGNOSIS — E890 Postprocedural hypothyroidism: Secondary | ICD-10-CM | POA: Diagnosis not present

## 2023-12-18 ENCOUNTER — Ambulatory Visit: Payer: Self-pay | Admitting: Nurse Practitioner

## 2023-12-18 DIAGNOSIS — E890 Postprocedural hypothyroidism: Secondary | ICD-10-CM

## 2023-12-18 LAB — T4, FREE: Free T4: 1.35 ng/dL (ref 0.82–1.77)

## 2023-12-18 LAB — TSH: TSH: 11.8 u[IU]/mL — ABNORMAL HIGH (ref 0.450–4.500)

## 2023-12-18 MED ORDER — LEVOTHYROXINE SODIUM 125 MCG PO TABS: 125.0000 ug | ORAL_TABLET | Freq: Every day | ORAL | 3 refills | Status: AC

## 2024-04-30 ENCOUNTER — Ambulatory Visit: Admitting: Nurse Practitioner

## 2024-05-27 ENCOUNTER — Encounter: Payer: Self-pay | Admitting: Nurse Practitioner

## 2024-05-27 ENCOUNTER — Ambulatory Visit (INDEPENDENT_AMBULATORY_CARE_PROVIDER_SITE_OTHER): Admitting: Nurse Practitioner

## 2024-05-27 VITALS — BP 134/79 | HR 77 | Temp 98.5°F | Ht 62.01 in | Wt 164.2 lb

## 2024-05-27 DIAGNOSIS — R7303 Prediabetes: Secondary | ICD-10-CM

## 2024-05-27 DIAGNOSIS — E890 Postprocedural hypothyroidism: Secondary | ICD-10-CM

## 2024-05-27 DIAGNOSIS — Z136 Encounter for screening for cardiovascular disorders: Secondary | ICD-10-CM | POA: Diagnosis not present

## 2024-05-27 DIAGNOSIS — E782 Mixed hyperlipidemia: Secondary | ICD-10-CM

## 2024-05-27 DIAGNOSIS — Z1211 Encounter for screening for malignant neoplasm of colon: Secondary | ICD-10-CM | POA: Diagnosis not present

## 2024-05-27 NOTE — Assessment & Plan Note (Signed)
 Chronic.  Controlled.  Continue with current medication regimen.  Labs ordered today.  Return to clinic in 6 months for reevaluation.  Call sooner if concerns arise.  ? ?

## 2024-05-27 NOTE — Progress Notes (Signed)
 "  BP 134/79 (BP Location: Left Arm, Patient Position: Sitting, Cuff Size: Normal)   Pulse 77   Temp 98.5 F (36.9 C) (Oral)   Ht 5' 2.01 (1.575 m)   Wt 164 lb 3.2 oz (74.5 kg)   SpO2 96%   BMI 30.02 kg/m    Subjective:    Patient ID: Kristin Escobar, female    DOB: 06/22/1950, 73 y.o.   MRN: [id number]  HPI: Kristin Escobar is a 73 y.o. female  Chief Complaint  Patient presents with   office visit    6 month F/u   HYPOTHYROIDISM Labs checked in April 2024 after patient had been off levothyroxine  due to being out of it.  TSH was 127.   Thyroid  control status:better Satisfied with current treatment? yes Medication side effects: no Medication compliance: excellent compliance Etiology of hypothyroidism:  Recent dose adjustment:no Fatigue: sometimes Cold intolerance: no Heat intolerance: no Weight gain: no Weight loss: no Constipation: no Diarrhea/loose stools: no Palpitations: no Lower extremity edema: no Anxiety/depressed mood: no  Relevant past medical, surgical, family and social history reviewed and updated as indicated. Interim medical history since our last visit reviewed. Allergies and medications reviewed and updated.  Review of Systems  Constitutional:  Positive for fatigue. Negative for unexpected weight change.  Cardiovascular:  Negative for palpitations and leg swelling.  Gastrointestinal:  Negative for constipation and diarrhea.  Endocrine: Negative for cold intolerance and heat intolerance.  Psychiatric/Behavioral:  Negative for dysphoric mood. The patient is not nervous/anxious.     Per HPI unless specifically indicated above     Objective:    BP 134/79 (BP Location: Left Arm, Patient Position: Sitting, Cuff Size: Normal)   Pulse 77   Temp 98.5 F (36.9 C) (Oral)   Ht 5' 2.01 (1.575 m)   Wt 164 lb 3.2 oz (74.5 kg)   SpO2 96%   BMI 30.02 kg/m   Wt Readings from Last 3 Encounters:  05/27/24 164 lb 3.2 oz (74.5 kg)  10/31/23 163 lb  (73.9 kg)  09/19/23 164 lb 9.6 oz (74.7 kg)    Physical Exam Vitals and nursing note reviewed.  Constitutional:      General: She is not in acute distress.    Appearance: Normal appearance. She is not ill-appearing, toxic-appearing or diaphoretic.  HENT:     Head: Normocephalic.     Right Ear: External ear normal.     Left Ear: External ear normal.     Nose: Nose normal.     Mouth/Throat:     Mouth: Mucous membranes are moist.     Pharynx: Oropharynx is clear.  Eyes:     General:        Right eye: No discharge.        Left eye: No discharge.     Extraocular Movements: Extraocular movements intact.     Conjunctiva/sclera: Conjunctivae normal.     Pupils: Pupils are equal, round, and reactive to light.  Cardiovascular:     Rate and Rhythm: Normal rate and regular rhythm.     Heart sounds: No murmur heard. Pulmonary:     Effort: Pulmonary effort is normal. No respiratory distress.     Breath sounds: Normal breath sounds. No wheezing or rales.  Musculoskeletal:     Cervical back: Normal range of motion and neck supple.  Skin:    General: Skin is warm and dry.     Capillary Refill: Capillary refill takes less than 2 seconds.  Neurological:  General: No focal deficit present.     Mental Status: She is alert and oriented to person, place, and time. Mental status is at baseline.  Psychiatric:        Mood and Affect: Mood normal.        Behavior: Behavior normal.        Thought Content: Thought content normal.        Judgment: Judgment normal.     Results for orders placed or performed in visit on 12/17/23  T4, free   Collection Time: 12/17/23  8:42 AM  Result Value Ref Range   Free T4 1.35 0.82 - 1.77 ng/dL  TSH   Collection Time: 12/17/23  8:42 AM  Result Value Ref Range   TSH 11.800 (H) 0.450 - 4.500 uIU/mL      Assessment & Plan:   Problem List Items Addressed This Visit       Endocrine   Postoperative hypothyroidism - Primary (Chronic)   Chronic.  TSH has  been elevated at last several visits.  Labs ordered at visit today.  Will refill medication once labs are back.  Follow up in 6 months.       Relevant Orders   T4   TSH     Other   Prediabetes (Chronic)   Chronic.  Controlled.  Continue with current medication regimen.  Labs ordered today.  Return to clinic in 6 months for reevaluation.  Call sooner if concerns arise.        Relevant Orders   Comprehensive metabolic panel with GFR   Hemoglobin A1c   Mixed hyperlipidemia (Chronic)   Chronic.  Controlled.  Continue with current medication regimen.  Labs ordered today.  Return to clinic in 6 months for reevaluation.  Call sooner if concerns arise.        Relevant Orders   Lipid panel   Other Visit Diagnoses       Screening for ischemic heart disease         Screening for colon cancer       Relevant Orders   Ambulatory referral to Gastroenterology        Follow up plan: Return in about 6 months (around 11/24/2024) for Physical and Fasting labs.      "

## 2024-05-27 NOTE — Assessment & Plan Note (Signed)
 Chronic.  TSH has been elevated at last several visits.  Labs ordered at visit today.  Will refill medication once labs are back.  Follow up in 6 months.

## 2024-05-28 LAB — LIPID PANEL
Chol/HDL Ratio: 3.9 ratio (ref 0.0–4.4)
Cholesterol, Total: 194 mg/dL (ref 100–199)
HDL: 50 mg/dL
LDL Chol Calc (NIH): 108 mg/dL — ABNORMAL HIGH (ref 0–99)
Triglycerides: 210 mg/dL — ABNORMAL HIGH (ref 0–149)
VLDL Cholesterol Cal: 36 mg/dL (ref 5–40)

## 2024-05-28 LAB — COMPREHENSIVE METABOLIC PANEL WITH GFR
ALT: 23 IU/L (ref 0–32)
AST: 22 IU/L (ref 0–40)
Albumin: 4.1 g/dL (ref 3.8–4.8)
Alkaline Phosphatase: 81 IU/L (ref 49–135)
BUN/Creatinine Ratio: 16 (ref 12–28)
BUN: 13 mg/dL (ref 8–27)
Bilirubin Total: 0.7 mg/dL (ref 0.0–1.2)
CO2: 24 mmol/L (ref 20–29)
Calcium: 8.8 mg/dL (ref 8.7–10.3)
Chloride: 105 mmol/L (ref 96–106)
Creatinine, Ser: 0.83 mg/dL (ref 0.57–1.00)
Globulin, Total: 2.7 g/dL (ref 1.5–4.5)
Glucose: 94 mg/dL (ref 70–99)
Potassium: 4 mmol/L (ref 3.5–5.2)
Sodium: 144 mmol/L (ref 134–144)
Total Protein: 6.8 g/dL (ref 6.0–8.5)
eGFR: 75 mL/min/1.73

## 2024-05-28 LAB — TSH: TSH: 2.41 u[IU]/mL (ref 0.450–4.500)

## 2024-05-28 LAB — HEMOGLOBIN A1C
Est. average glucose Bld gHb Est-mCnc: 114 mg/dL
Hgb A1c MFr Bld: 5.6 % (ref 4.8–5.6)

## 2024-05-28 LAB — T4: T4, Total: 8.8 ug/dL (ref 4.5–12.0)

## 2024-05-29 ENCOUNTER — Ambulatory Visit: Payer: Self-pay | Admitting: Nurse Practitioner

## 2024-11-23 ENCOUNTER — Encounter: Admitting: Nurse Practitioner
